# Patient Record
Sex: Male | Born: 1945 | Race: White | Hispanic: No | Marital: Married | Smoking: Former smoker
Health system: Southern US, Community
[De-identification: ages and names within clinical notes are randomized; demographics above are authoritative.]

## PROBLEM LIST (undated history)

## (undated) DIAGNOSIS — K5090 Crohn's disease, unspecified, without complications: Secondary | ICD-10-CM

## (undated) DIAGNOSIS — M1990 Unspecified osteoarthritis, unspecified site: Secondary | ICD-10-CM

## (undated) DIAGNOSIS — E785 Hyperlipidemia, unspecified: Secondary | ICD-10-CM

## (undated) DIAGNOSIS — C801 Malignant (primary) neoplasm, unspecified: Secondary | ICD-10-CM

## (undated) DIAGNOSIS — I2510 Atherosclerotic heart disease of native coronary artery without angina pectoris: Secondary | ICD-10-CM

## (undated) DIAGNOSIS — I493 Ventricular premature depolarization: Secondary | ICD-10-CM

## (undated) DIAGNOSIS — E119 Type 2 diabetes mellitus without complications: Secondary | ICD-10-CM

## (undated) DIAGNOSIS — I739 Peripheral vascular disease, unspecified: Secondary | ICD-10-CM

## (undated) DIAGNOSIS — I10 Essential (primary) hypertension: Secondary | ICD-10-CM

## (undated) HISTORY — DX: Atherosclerotic heart disease of native coronary artery without angina pectoris: I25.10

## (undated) HISTORY — DX: Malignant (primary) neoplasm, unspecified: C80.1

## (undated) HISTORY — DX: Essential (primary) hypertension: I10

## (undated) HISTORY — PX: OTHER SURGICAL HISTORY: SHX169

## (undated) HISTORY — PX: HERNIA REPAIR: SHX51

## (undated) HISTORY — DX: Hyperlipidemia, unspecified: E78.5

## (undated) HISTORY — PX: CATARACT EXTRACTION, BILATERAL: SHX1313

## (undated) HISTORY — DX: Crohn's disease, unspecified, without complications: K50.90

## (undated) HISTORY — DX: Peripheral vascular disease, unspecified: I73.9

---

## 1970-04-14 HISTORY — PX: VASECTOMY: SHX75

## 2001-04-14 HISTORY — PX: INSERTION PROSTATE RADIATION SEED: SUR718

## 2001-04-14 HISTORY — PX: CRYOTHERAPY: SHX1416

## 2002-01-14 ENCOUNTER — Encounter: Admission: RE | Admit: 2002-01-14 | Discharge: 2002-01-14 | Payer: Self-pay | Admitting: Urology

## 2002-01-14 ENCOUNTER — Encounter: Payer: Self-pay | Admitting: Urology

## 2002-01-28 ENCOUNTER — Ambulatory Visit: Admission: RE | Admit: 2002-01-28 | Discharge: 2002-02-11 | Payer: Self-pay | Admitting: Radiation Oncology

## 2002-02-28 ENCOUNTER — Ambulatory Visit: Admission: RE | Admit: 2002-02-28 | Discharge: 2002-04-26 | Payer: Self-pay | Admitting: Radiation Oncology

## 2002-02-28 ENCOUNTER — Encounter: Admission: RE | Admit: 2002-02-28 | Discharge: 2002-02-28 | Payer: Self-pay | Admitting: Urology

## 2002-02-28 ENCOUNTER — Encounter: Payer: Self-pay | Admitting: Urology

## 2002-04-01 ENCOUNTER — Ambulatory Visit (HOSPITAL_BASED_OUTPATIENT_CLINIC_OR_DEPARTMENT_OTHER): Admission: RE | Admit: 2002-04-01 | Discharge: 2002-04-01 | Payer: Self-pay | Admitting: Urology

## 2002-04-22 ENCOUNTER — Ambulatory Visit: Admission: RE | Admit: 2002-04-22 | Discharge: 2002-04-22 | Payer: Self-pay | Admitting: Radiation Oncology

## 2003-11-30 ENCOUNTER — Encounter (INDEPENDENT_AMBULATORY_CARE_PROVIDER_SITE_OTHER): Payer: Self-pay | Admitting: *Deleted

## 2003-11-30 ENCOUNTER — Ambulatory Visit (HOSPITAL_COMMUNITY): Admission: RE | Admit: 2003-11-30 | Discharge: 2003-11-30 | Payer: Self-pay | Admitting: Gastroenterology

## 2003-12-11 ENCOUNTER — Ambulatory Visit (HOSPITAL_COMMUNITY): Admission: RE | Admit: 2003-12-11 | Discharge: 2003-12-11 | Payer: Self-pay | Admitting: Gastroenterology

## 2004-03-26 ENCOUNTER — Encounter: Admission: RE | Admit: 2004-03-26 | Discharge: 2004-03-26 | Payer: Self-pay | Admitting: Gastroenterology

## 2006-09-13 ENCOUNTER — Emergency Department (HOSPITAL_COMMUNITY): Admission: EM | Admit: 2006-09-13 | Discharge: 2006-09-13 | Payer: Self-pay | Admitting: Emergency Medicine

## 2006-09-23 ENCOUNTER — Inpatient Hospital Stay (HOSPITAL_BASED_OUTPATIENT_CLINIC_OR_DEPARTMENT_OTHER): Admission: RE | Admit: 2006-09-23 | Discharge: 2006-09-23 | Payer: Self-pay | Admitting: Cardiovascular Disease

## 2006-12-22 ENCOUNTER — Emergency Department (HOSPITAL_COMMUNITY): Admission: EM | Admit: 2006-12-22 | Discharge: 2006-12-22 | Payer: Self-pay | Admitting: Emergency Medicine

## 2008-10-19 ENCOUNTER — Ambulatory Visit (HOSPITAL_COMMUNITY): Admission: RE | Admit: 2008-10-19 | Discharge: 2008-10-19 | Payer: Self-pay | Admitting: Urology

## 2008-12-01 ENCOUNTER — Ambulatory Visit (HOSPITAL_BASED_OUTPATIENT_CLINIC_OR_DEPARTMENT_OTHER): Admission: RE | Admit: 2008-12-01 | Discharge: 2008-12-01 | Payer: Self-pay | Admitting: Urology

## 2008-12-28 DIAGNOSIS — E1121 Type 2 diabetes mellitus with diabetic nephropathy: Secondary | ICD-10-CM | POA: Insufficient documentation

## 2008-12-28 DIAGNOSIS — K5090 Crohn's disease, unspecified, without complications: Secondary | ICD-10-CM | POA: Insufficient documentation

## 2008-12-28 DIAGNOSIS — I10 Essential (primary) hypertension: Secondary | ICD-10-CM | POA: Insufficient documentation

## 2008-12-28 DIAGNOSIS — E7800 Pure hypercholesterolemia, unspecified: Secondary | ICD-10-CM | POA: Insufficient documentation

## 2009-02-08 DIAGNOSIS — Z0289 Encounter for other administrative examinations: Secondary | ICD-10-CM | POA: Insufficient documentation

## 2009-07-04 ENCOUNTER — Ambulatory Visit (HOSPITAL_BASED_OUTPATIENT_CLINIC_OR_DEPARTMENT_OTHER): Admission: RE | Admit: 2009-07-04 | Discharge: 2009-07-04 | Payer: Self-pay | Admitting: General Surgery

## 2010-02-11 DIAGNOSIS — F17200 Nicotine dependence, unspecified, uncomplicated: Secondary | ICD-10-CM | POA: Insufficient documentation

## 2010-02-11 DIAGNOSIS — F1911 Other psychoactive substance abuse, in remission: Secondary | ICD-10-CM | POA: Insufficient documentation

## 2010-07-07 LAB — BASIC METABOLIC PANEL
CO2: 27 mEq/L (ref 19–32)
Chloride: 105 mEq/L (ref 96–112)
Creatinine, Ser: 0.8 mg/dL (ref 0.4–1.5)
GFR calc Af Amer: >60 mL/min (ref >60)
Glucose, Bld: 242 mg/dL — ABNORMAL HIGH (ref 70–99)

## 2010-07-07 LAB — GLUCOSE, CAPILLARY
Glucose-Capillary: 180 mg/dL — ABNORMAL HIGH (ref 70–99)
Glucose-Capillary: 209 mg/dL — ABNORMAL HIGH (ref 70–99)

## 2010-07-20 LAB — GLUCOSE, CAPILLARY: Glucose-Capillary: 185 mg/dL — ABNORMAL HIGH (ref 70–99)

## 2010-07-20 LAB — POCT I-STAT, CHEM 8
BUN: 18 mg/dL (ref 6–23)
Chloride: 104 mEq/L (ref 96–112)
Glucose, Bld: 177 mg/dL — ABNORMAL HIGH (ref 70–99)
HCT: 42 % (ref 39.0–52.0)
Potassium: 4.5 mEq/L (ref 3.5–5.1)

## 2010-08-16 DIAGNOSIS — E1165 Type 2 diabetes mellitus with hyperglycemia: Secondary | ICD-10-CM | POA: Insufficient documentation

## 2010-08-27 NOTE — H&P (Signed)
NAME:  PERETZ, THIEME NO.:  [id number]   MEDICAL RECORD NO.:  [id number]          PATIENT TYPE:  EMS   LOCATION:  MAJO                         FACILITY:  [facility]   PHYSICIAN:  Vesta Mixer, M.D. DATE OF BIRTH:  1945/11/22   DATE OF ADMISSION:  09/13/2006  DATE OF DISCHARGE:  09/13/2006                              HISTORY & PHYSICAL   Ryan Saunders is a 65 year old gentleman with a history of fatigue and  dehydration, and an abnormal Cardiolite study.  He is now admitted for  heart catheterization for further evaluation of Ryan abnormal Cardiolite  study.   Ryan Saunders has multiple risk factors for coronary artery disease.  He  has a history of hypertension, hypercholesterolemia, diabetes mellitus  and cigarette smoking.  He recently had an episode of syncope.  It was  thought that it maybe just due to dehydration.  He has not had any  further episodes.  He had a stress Cardiolite study which revealed some  attenuation on the inferior wall.  It was unclear whether it was due to  a loop of bowel that was causing some artifact or perhaps real.  We  brought him back in the office today to discuss these results.   Ryan left ventricle appeared to be mildly dilated although the ejection  fraction was normal.   He denies any recent episodes of syncope or presyncope.  He denies any  PND or orthopnea.  He denies any chest discomfort.   CURRENT MEDICATIONS:  1. Lisinopril once a day.  2. Glyburide 5 mg a day.  3. Uroxatral once a day.  4. Lipitor 80 mg a day.  5. Aspirin 81 mg a day.   ALLERGIES:  No allergies.   PAST MEDICAL HISTORY:  1. Hyperlipidemia.  2. Hypertension.  3. Diabetes mellitus.   SOCIAL HISTORY:  The patient quit smoking yesterday.   FAMILY HISTORY:  Ryan Saunders died at age 35 due to complications related  to diabetes mellitus.  Ryan Saunders also had coronary artery disease.  Ryan  Saunders is 65 years old and has Crohn disease.   REVIEW OF  SYSTEMS:  Ryan review of systems is reviewed and is essentially  negative except as noted in the HPI.   PHYSICAL EXAMINATION:  GENERAL:  On exam, he is a middle-aged gentleman  in no acute distress.  He is alert and oriented x3, and Ryan mood and  affect are normal.  VITAL SIGNS:  Ryan weight is 193.  Ryan blood pressure 134/70 with heart  rate of 72.  HEENT:  Exam reveals 2+ carotids.  There is no bruits, no JVD, no  thyromegaly.  LUNGS: Clear to auscultation.  HEART:  Regular rate S1-S2.  ABDOMEN:  Exam reveals good bowel sounds and is nontender.  EXTREMITIES:  Has no clubbing, cyanosis or edema.  NEUROLOGIC:  Exam is nonfocal.   I discussed the situation with Ryan Saunders and Ryan wife.  He would like  to proceed with heart catheterization, so that we can clear air.  He  certainly could be having silent ischemia since he has a history of  diabetes mellitus.  We have discussed the risks, benefits and options of  heart catheterization.  He understands and agrees to proceed.           ______________________________  Vesta Mixer, M.D.     PJN/MEDQ  D:  09/17/2006  T:  09/17/2006  Job:  [id number]   cc:   Ryan Saunders, M.D.

## 2010-08-27 NOTE — Cardiovascular Report (Signed)
NAME:  FRANCISCOJAVIER, WRONSKI NO.:  [id number]   MEDICAL RECORD NO.:  [id number]          PATIENT TYPE:  OIB   LOCATION:  1961                         FACILITY:  [facility]   PHYSICIAN:  Vesta Mixer, M.D. DATE OF BIRTH:  26-Mar-1946   DATE OF PROCEDURE:  09/23/2006  DATE OF DISCHARGE:  09/23/2006                            CARDIAC CATHETERIZATION   Mr. Mano is a 65 year old gentleman with a history of fatigue and a  presyncopal episode.  He had an abnormal Cardiolite study and is now  referred for heart catheterization for further evaluation.   PROCEDURE:  Left heart catheterization with coronary angiography.   The right femoral artery was easily cannulated using the modified  Seldinger technique.   HEMODYNAMIC RESULTS:  LV pressure is 110/9 with an aortic pressure of  118/59.   ANGIOGRAPHY:  Left main:  The left main is fairly smooth and normal.   The left anterior descending artery has a few minor luminal  irregularities.  The first diagonal artery is small to moderate in size  and is normal.  The second diagonal artery is also small to moderate in  size and is normal.   The left circumflex artery is a rather large branch.  It is normal  throughout its course.  The first OM is fairly small but normal.  The  second obtuse marginal are artery is a fairly large branch and extends  down to the posterolateral region.   The right coronary artery is large and dominant.  It is normal  throughout its course.  The posterior descending artery is normal.  There is a very small posterolateral branch.   The left ventriculogram was performed in the 30 RAO position.  It  reveals overall normal left ventricular systolic function.  The ejection  fraction is around 60%.  There is no mitral regurgitation.   COMPLICATIONS:  None.   CONCLUSIONS:  1. Minor coronary artery irregularities.  2. Normal left ventricular systolic function.   We will continue with medical  therapy.          ______________________________  Vesta Mixer, M.D.    PJN/MEDQ  D:  09/23/2006  T:  09/23/2006  Job:  [id number]   cc:   Gaspar Garbe, M.D.

## 2010-08-27 NOTE — Op Note (Signed)
NAME:  Ryan Saunders, Ryan Saunders NO.:  [id number]   MEDICAL RECORD NO.:  [id number]          PATIENT TYPE:  AMB   LOCATION:  NESC                         FACILITY:  [facility]   PHYSICIAN:  Sigmund I. Patsi Sears, M.D.DATE OF BIRTH:  1945-12-03   DATE OF PROCEDURE:  12/01/2008  DATE OF DISCHARGE:                               OPERATIVE REPORT   PREOPERATIVE DIAGNOSIS:  Recurrent prostate adenocarcinoma.   POSTOPERATIVE DIAGNOSIS:  Recurrent prostate adenocarcinoma.   OPERATIONS:  Cryotherapy of the prostate with cystourethroscopy.   SURGEON:  Sigmund I. Patsi Sears, M.D.   ANESTHESIA:  General LMA.   PREPARATION:  After appropriate preanesthesia, the patient is brought to  the operating room and placed on the operating room in dorsal supine  position where general LMA anesthesia was induced.  He was then replaced  in dorsal lithotomy position where the pubis was prepped with Betadine  solution and draped in usual fashion.   REVIEW OF HISTORY:  Mr. Gauthreaux is a 65 year old male, status post I-125  seed implantation in 2003 for Gleason 3+3 adenocarcinoma prostate with a  PSA of 5.8.  The patient has history of PSA of 1.19 in in 2009,  rising  to 2.17 in December, then 3.35 in August 2010.  He is felt to have a  biochemical recurrence.  The patient underwent repeat prostate biopsy,  and too small foci of prostate cancer without treatment effect are  identified in the left lateral base, in the left mid prostate, Gleason  3+3, and Gleason 3+4.  The patient is now for cryotherapy of the  prostate.   PROCEDURE:  Flexible cystoscopy accomplished, and shows that there is no  urethral stricture.  No bladder stone was noted.  There is no  trabeculation and no abnormality of prostate, of the bladder was  identified.  Following this, Foley catheter was placed and 10 mL were  placed in the balloon.  The scrotum was then prepped, and taped away.  Transrectal ultrasonography was  accomplished, and needles were placed in  the prostate in five different rows.  The Foley is removed, and the  flexible cystoscopy again accomplished, with the guidewire placement.  The cooling urethral coil was then placed, and the patient then  undergoes 2 cycles of freezing and following, with the first active thaw  in the second being passive.  The patient tolerated procedure well.  He  then underwent 20 minutes of a passive thaw at the end of the procedure,  and Foley catheter was replaced.  He was given IV Toradol, awakened, and  taken recovery room in good condition.      Sigmund I. Patsi Sears, M.D.  Electronically Signed     SIT/MEDQ  D:  12/01/2008  T:  12/01/2008  Job:  [id number]

## 2010-08-30 NOTE — Op Note (Signed)
NAME:  Ryan Saunders, Ryan Saunders NO.:  [id number]   MEDICAL RECORD NO.:  [id number]                   PATIENT TYPE:  AMB   LOCATION:  ENDO                                 FACILITY:  [facility]   PHYSICIAN:  John C. Madilyn Fireman, M.D.                 DATE OF BIRTH:  [birth year]   DATE OF PROCEDURE:  11/30/2003  DATE OF DISCHARGE:                                 OPERATIVE REPORT   PROCEDURE:  Colonoscopy with polypectomy.   INDICATIONS FOR PROCEDURE:  Terminal ileal thickening on CT scan, ordered to  evaluate hematuria.  The patient has also had no prior colon screening.  He  is essentially asymptomatic from a GI standpoint.   DESCRIPTION OF PROCEDURE:  The patient was placed in the left lateral  decubitus position and placed on the pulse monitor with continuous low-flow  oxygen delivered by nasal cannula.  He was sedated with 100 mcg IV fentanyl  and 8 mg IV Versed.  The Olympus video colonoscope was inserted into the  rectum and advanced to the cecum, confirmed by transillumination at  McBurney's point and visualization of the ileocecal valve and appendiceal  orifice.  The prep was fairly good but somewhat suboptimal in some areas,  and I could not rule out small lesions less than 1 cm in all areas.  The  base of the cecum appeared normal.  The ileocecal valve also appeared  normal.  I could get the scope to the edge of the ileal orifice but never  could achieve deep intubation.  I could see some of the distal ileal mucosa,  and it did not appear grossly inflamed, although it did appear somewhat  tortuous with some possible scarring.  Overall, the endoscopic evaluation of  the terminal ileum was incomplete and inconclusive in regards to the CT  abnormality.  Just distal to the ileocecal valve, was a cluster of small  polyps that appeared to have some associated deformity or scarring of the  mucosa.  This raised the question of these representing pseudopolyps  associated  with inflammatory bowel disease.  I was able to ensnare the  majority of the cluster of  those polyps and removed and cauterized it for  histologic evaluation.  The remainder of the ascending, transverse,  descending, sigmoid, and rectum appeared normal with no further polyps,  masses, diverticula, or other inflammatory changes seen.  The scope was then  withdrawn, and the patient returned to the recovery room in stable  condition.  He tolerated the procedure well, and there were no immediate  complications.   IMPRESSION:  1. Polyps or pseudopolyps of the ascending colon just distal to the     ileocecal valve.  2. Incomplete evaluation of the terminal ileum regarding thickening of the     terminal ileum seen on CT scan.   PLAN:  Await biopsy results and will probably pursue a small bowel series,  particularly  if polyps turn out to be inflammatory rather than neoplastic.                                               John C. Madilyn Fireman, M.D.   JCH/MEDQ  D:  11/30/2003  T:  11/30/2003  Job:  [id number]   cc:   Lynelle Smoke I. Patsi Sears, M.D.  [address]  [place]  [place] [address]  Fax: [phone]

## 2010-08-30 NOTE — Op Note (Signed)
   NAME:  Ryan Saunders, Ryan Saunders NO.:  [id number]   MEDICAL RECORD NO.:  [id number]                   PATIENT TYPE:  AMB   LOCATION:  NESC                                 [facility]ACILITY[facility]  [facility]   PHYSICIAN:  Sigmund I. Patsi Sears, M.D.         DATE OF BIRTH:  06/20/1945   DATE OF PROCEDURE:  04/01/2002  DATE OF DISCHARGE:                                 OPERATIVE REPORT   PREOPERATIVE DIAGNOSIS:  Adenocarcinoma of the prostate.   POSTOPERATIVE DIAGNOSIS:  Adenocarcinoma of the prostate.   OPERATION:  Placement of I-125 seeds for treatment of prostate cancer.   SURGEON:  Sigmund I. Patsi Sears, M.D.   RADIATION THERAPIST:  Wynn Banker, M.D.   PREPARATION:  After appropriate preanesthesia, the patient is brought to the  operating room and placed on the operating table in the dorsal supine  position where general LMA anesthesia was introduced.  He was then re-placed  in the dorsal lithotomy position, where the pubis was prepped with Betadine  solution and draped in the usual fashion.   REVIEW OF HISTORY:  This 65 year old male, has Gleason 3 + 3 (6)  adenocarcinoma of the prostate, with a PSA of 5.8.  He has a 29 cc gland by  ultrasound, an International Prostate Symptoms score sheet of 1/7, and  continues to have erectile function.  The patient is now for radioactive  seed implantation.   DESCRIPTION OF PROCEDURE:  The patient underwent subsequent insertions of 29  needles, with a total number of 90 seeds, of I-125.  He tolerated the  procedure quite well with excellent distribution of seeds.  Photodocumentation was accomplished.  The seeds during cystoscopy was  accomplished at the end of the case  which showed a blood clot in the urethra but no evidence of these seeds  within the urethra.  There were no seeds within the bladder either.  A Foley  catheter was placed; the patient was awakened and taken to the recovery room  in good condition.                                           Sigmund I. Patsi Sears, M.D.    SIT/MEDQ  D:  04/01/2002  T:  04/01/2002  Job:  [id number]   cc:   Wynn Banker, M.D.  [address] [facility]  [place]  [place]  [address]  Fax: [phone]   Dr. [facility]

## 2011-01-30 LAB — POCT CARDIAC MARKERS
CKMB, poc: 1.2
Myoglobin, poc: 69.9
Myoglobin, poc: 70.9
Operator id: 196461
Operator id: 277751

## 2011-01-30 LAB — I-STAT 8, (EC8 V) (CONVERTED LAB)
BUN: 29 — ABNORMAL HIGH
Glucose, Bld: 156 — ABNORMAL HIGH
Hemoglobin: 13.6
Potassium: 4.4
Sodium: 141

## 2013-03-01 DIAGNOSIS — R809 Proteinuria, unspecified: Secondary | ICD-10-CM | POA: Insufficient documentation

## 2013-09-01 ENCOUNTER — Other Ambulatory Visit: Payer: Self-pay | Admitting: Gastroenterology

## 2013-09-21 DIAGNOSIS — D126 Benign neoplasm of colon, unspecified: Secondary | ICD-10-CM | POA: Insufficient documentation

## 2014-03-14 DIAGNOSIS — I739 Peripheral vascular disease, unspecified: Secondary | ICD-10-CM | POA: Insufficient documentation

## 2014-03-14 DIAGNOSIS — N182 Chronic kidney disease, stage 2 (mild): Secondary | ICD-10-CM | POA: Insufficient documentation

## 2014-04-21 ENCOUNTER — Encounter: Payer: Self-pay | Admitting: Cardiovascular Disease

## 2014-04-21 ENCOUNTER — Ambulatory Visit (INDEPENDENT_AMBULATORY_CARE_PROVIDER_SITE_OTHER): Payer: Medicare HMO | Admitting: Cardiovascular Disease

## 2014-04-21 VITALS — BP 128/78 | HR 65 | Ht 72.0 in | Wt 193.0 lb

## 2014-04-21 DIAGNOSIS — I2510 Atherosclerotic heart disease of native coronary artery without angina pectoris: Secondary | ICD-10-CM

## 2014-04-21 DIAGNOSIS — E785 Hyperlipidemia, unspecified: Secondary | ICD-10-CM

## 2014-04-21 DIAGNOSIS — I10 Essential (primary) hypertension: Secondary | ICD-10-CM | POA: Insufficient documentation

## 2014-04-21 NOTE — Progress Notes (Signed)
     Ryan Saunders Date of Birth  [birth year]       [place] [address], [address], [place], [place]  [address]   [place], [place]  [address] [place]   Fax  [phone]     Fax [phone]  Problem List: 1. CAD - mild by cath in 2007 2. Hypertension 3, hyperlipidemia 4. Diabetes Mellitus 5. Prostate cancer  History of Present Illness:  Ryan Saunders is a 69 yo man who I have seen in the past.  He is not having any specific issues. He has not had any CP or dyspnea.  Exercises on occasion.    Plays golf,  Retired from [employer].     Current Outpatient Prescriptions  Medication Sig Dispense Refill  . albuterol (PROAIR HFA) 108 (90 BASE) MCG/ACT inhaler as needed.    Marland Kitchen atorvastatin (LIPITOR) 80 MG tablet Take 80 mg by mouth daily.    Marland Kitchen lisinopril (PRINIVIL,ZESTRIL) 40 MG tablet Take 40 mg by mouth daily.    . norethindrone (NOR-QD) 0.35 MG tablet Take 81 mg by mouth daily.    Marland Kitchen VICTOZA 18 MG/3ML SOPN Inject into the skin daily.  6  . ZETIA 10 MG tablet Take 10 mg by mouth daily.  3   No current facility-administered medications for this visit.     Allergies  Allergen Reactions  . Bee Venom     Other reaction(s): swelling in lips and mouth    Past Medical History  Diagnosis Date  . Coronary artery disease   . PVD (peripheral vascular disease)   . Cancer     PROSTATE CANCER  . Hypertension   . Hyperlipidemia   . Crohn's disease     Past Surgical History  Procedure Laterality Date  . Vasectomy  1972  . Anal fistula repair      History  Smoking status  . Former Smoker  Smokeless tobacco  . Not on file    History  Alcohol Use  . Yes    Comment: SOCAILLY    Family History  Problem Relation Age of Onset  . CVA Mother   . Heart attack Mother   . Crohn's disease Mother   . Diabetes Father   . Heart failure Paternal Grandmother     Reviw of  Systems:  Reviewed in the HPI.  All other systems are negative.  Physical Exam: Blood pressure 128/78, pulse 65, height 6' (1.829 m), weight 193 lb (87.544 kg). Wt Readings from Last 3 Encounters:  04/21/14 193 lb (87.544 kg)     General: Well developed, well nourished, in no acute distress.  Head: Normocephalic, atraumatic, sclera non-icteric, mucus membranes are moist,   Neck: Supple. Carotids are 2 + without bruits. No JVD   Lungs: Clear   Heart: Rr, normal S12  Abdomen: Soft, non-tender, non-distended with normal bowel sounds.  Msk:  Strength and tone are normal   Extremities: No clubbing or cyanosis. No edema.  Distal pedal pulses are 2+ and equal    Neuro: CN II - XII intact.  Alert and oriented X 3.   Psych:  Normal   ECG: Jan. 8, 2016:  NSR at 65,  RBBB,   Assessment / Plan:

## 2014-04-21 NOTE — Patient Instructions (Signed)
Your physician recommends that you continue on your current medications as directed. Please refer to the Current Medication list given to you today.  Your physician wants you to follow-up in: 1 year with Dr. Nahser.  You will receive a reminder letter in the mail two months in advance. If you don't receive a letter, please call our office to schedule the follow-up appointment.  

## 2014-04-21 NOTE — Assessment & Plan Note (Signed)
Has mild CAD by cath in ~ 2007. No angina Continue aggressive treatment of his HTN and hyperlipidemia

## 2014-04-21 NOTE — Assessment & Plan Note (Signed)
Followed closely by Dr. Osborne Casco.

## 2014-04-21 NOTE — Assessment & Plan Note (Signed)
BP is well controlled.  Continue current meds  

## 2015-01-17 ENCOUNTER — Telehealth: Payer: Self-pay | Admitting: Cardiovascular Disease

## 2015-01-17 NOTE — Telephone Encounter (Signed)
Spoke with patient who states on Saturday he had an episode of light-headedness, nausea, diaphoresis, and left hand numbness/paralysis that lasted about 20 minutes.  He states he and his wife were doing yard work when the symptoms began, his wife brought him in and got him cooled down, and that he started feeling better after sitting for a little while.  States he checked his pulse during that time and noticed that it felt irregular and continues to feel irregular.  He states his BP has been normal.  He denies feeling light-headed or dizzy since that time; denies chest pain.  He states he did not note any symptoms prior to Saturday.  I offered him an appointment with Dr. Acie Fredrickson for tomorrow and he verbalized agreement to come for evaluation tomorrow at 4:15.

## 2015-01-17 NOTE — Telephone Encounter (Signed)
New message  Pt called C/O irregular heart beat  Patient c/o Palpitations:  High priority if patient c/o lightheadedness and shortness of breath.  1. How long have you been having palpitations? Second episode in 5-6 years.   2. Are you currently experiencing lightheadedness and shortness of breath? No, not really. Per pt he was working in the yard. Got hot with cold sweats.   3. Have you checked your BP and heart rate? (document readings) Yes. Per pt, BP seems to be fine 120/75 (or something like that says pt)   4. Are you experiencing any other symptoms? "Nope". No blurred vision. Only paralysis in left hand for about 20 minutes.   Comments: Pt states that he feels fine but his heart is beating irregularly. It stops and then resumes

## 2015-01-18 ENCOUNTER — Ambulatory Visit (INDEPENDENT_AMBULATORY_CARE_PROVIDER_SITE_OTHER): Payer: Medicare HMO | Admitting: Cardiovascular Disease

## 2015-01-18 ENCOUNTER — Encounter: Payer: Self-pay | Admitting: Cardiovascular Disease

## 2015-01-18 VITALS — BP 146/94 | HR 65 | Ht 72.0 in | Wt 196.0 lb

## 2015-01-18 DIAGNOSIS — I10 Essential (primary) hypertension: Secondary | ICD-10-CM

## 2015-01-18 DIAGNOSIS — I2510 Atherosclerotic heart disease of native coronary artery without angina pectoris: Secondary | ICD-10-CM

## 2015-01-18 DIAGNOSIS — R002 Palpitations: Secondary | ICD-10-CM

## 2015-01-18 DIAGNOSIS — E860 Dehydration: Secondary | ICD-10-CM | POA: Diagnosis not present

## 2015-01-18 LAB — LIPID PANEL
CHOL/HDL RATIO: 3.5 ratio (ref <=5.0)
CHOLESTEROL: 140 mg/dL (ref 125–200)
HDL: 40 mg/dL (ref >=40)
LDL Cholesterol: 78 mg/dL (ref <130)
Triglycerides: 108 mg/dL (ref <150)
VLDL: 22 mg/dL (ref <30)

## 2015-01-18 LAB — HEPATIC FUNCTION PANEL
ALBUMIN: 4.2 g/dL (ref 3.6–5.1)
ALK PHOS: 79 U/L (ref 40–115)
ALT: 28 U/L (ref 9–46)
AST: 23 U/L (ref 10–35)
BILIRUBIN TOTAL: 0.5 mg/dL (ref 0.2–1.2)
Bilirubin, Direct: 0.1 mg/dL (ref <=0.2)
Indirect Bilirubin: 0.4 mg/dL (ref 0.2–1.2)
Total Protein: 7 g/dL (ref 6.1–8.1)

## 2015-01-18 LAB — BASIC METABOLIC PANEL
BUN: 23 mg/dL (ref 7–25)
CHLORIDE: 103 mmol/L (ref 98–110)
CO2: 26 mmol/L (ref 20–31)
Calcium: 9.5 mg/dL (ref 8.6–10.3)
Creat: 0.86 mg/dL (ref 0.70–1.18)
GLUCOSE: 116 mg/dL — AB (ref 65–99)
POTASSIUM: 4.8 mmol/L (ref 3.5–5.3)
SODIUM: 140 mmol/L (ref 135–146)

## 2015-01-18 NOTE — Progress Notes (Signed)
Ryan Saunders Date of Birth  1946/01/06       [place] [address], [address] [place], [place], [place]  [address]   [place], [place]  [address] [place]   Fax  [phone]     Fax [phone]  Problem List: 1. CAD - mild by cath in 2007 2. Hypertension 3, hyperlipidemia 4. Diabetes Mellitus 5. Prostate cancer  History of Present Illness:  Ryan Saunders is a 69 yo man who I have seen in the past.  He is not having any specific issues. He has not had any CP or dyspnea.  Exercises on occasion.    Plays golf,  Retired from [employer].  Oct. 6. 2016:  No CP , no dyspnea.  Has been having some palpitations. Has had some episodes of nausea and feeling poorly after exercising or being out in the heat.  His symptoms resolved over the next 30 minutes.  Has not tried to aggressively hydrate.     Current Outpatient Prescriptions  Medication Sig Dispense Refill  . albuterol (PROAIR HFA) 108 (90 BASE) MCG/ACT inhaler as needed.    Marland Kitchen aspirin 81 MG tablet Take 81 mg by mouth daily.    Marland Kitchen atorvastatin (LIPITOR) 80 MG tablet Take 80 mg by mouth daily.    . Grape Seed 100 MG CAPS Take 1 capsule by mouth daily.    Marland Kitchen LEVEMIR FLEXTOUCH 100 UNIT/ML Pen     . lisinopril (PRINIVIL,ZESTRIL) 40 MG tablet Take 40 mg by mouth daily.    . Multiple Vitamins-Minerals (ICAPS AREDS 2 PO) Take 1 capsule by mouth daily.    Marland Kitchen VICTOZA 18 MG/3ML SOPN Inject into the skin daily.  6  . ZETIA 10 MG tablet Take 10 mg by mouth daily.  3   No current facility-administered medications for this visit.     Allergies  Allergen Reactions  . Bee Venom     Other reaction(s): swelling in lips and mouth    Past Medical History  Diagnosis Date  . Coronary artery disease   . PVD (peripheral vascular disease) ([place])   . Cancer [facility])     PROSTATE CANCER  . Hypertension   . Hyperlipidemia   . Crohn's disease  [facility])     Past Surgical History  Procedure Laterality Date  . Vasectomy  1972  . Anal fistula repair      History  Smoking status  . Former Smoker  Smokeless tobacco  . Not on file    History  Alcohol Use  . Yes    Comment: SOCAILLY    Family History  Problem Relation Age of Onset  . CVA Mother   . Heart attack Mother   . Crohn's disease Mother   . Diabetes Father   . Heart failure Paternal Grandmother     Reviw of Systems:  Reviewed in the HPI.  All other systems are negative.  Physical Exam: Blood pressure 146/94, pulse 65, height 6' (1.829 m), weight 88.905 kg (196 lb). Wt Readings from Last 3 Encounters:  01/18/15 88.905 kg (196 lb)  04/21/14 87.544 kg (193 lb)     General: Well developed, well nourished, in no acute distress.  Head: Normocephalic, atraumatic, sclera non-icteric, mucus membranes are moist,   Neck: Supple. Carotids are 2 + without bruits. No JVD   Lungs: Clear   Heart: Rr, normal S12  Abdomen: Soft, non-tender, non-distended  with normal bowel sounds.  Msk:  Strength and tone are normal   Extremities: No clubbing or cyanosis. No edema.  Distal pedal pulses are 2+ and equal    Neuro: CN II - XII intact.  Alert and oriented X 3.   Psych:  Normal   ECG: Oct. 6, 2016:  NSR , inc. RBBB   Assessment / Plan:   1. CAD - mild by cath in 2007-  stable 2. Hypertension 3, hyperlipidemia 4. Diabetes Mellitus 5. Prostate cancer 6.  Dehydration: He presents today with some symptoms that sound like volume depleted . He has episodes of nausea and weakness that typically occur if he's been out working in the yard. He also had the same symptoms while walking at the beach. I think that he was probably volume depleted. I've recommended that he increase his intake of fluids. I've recommended that he drink V8 juice occasionally. He also should increase his electrolytes and protein intake.  He'll call me back if his symptoms  persist.    Ryan Saunders, Wonda Cheng, MD  01/18/2015 4:12 PM    [facility] [place],  [place], [place]  [address] Pager [phone] Phone: [phone]; Fax: [phone]   [facility]  [address] [place], [place]  [address] [phone]   Fax [phone]

## 2015-01-18 NOTE — Patient Instructions (Addendum)
Increase your intake of fluids (water with electrolyte tabs like Nun tablets, or gatorade) , protein ( hard boiled eggs, chicken, fish) , and a electrolytes ( V-8 juice, salt, potassium chloride  which is sold as No-Salt  Medication Instructions:  Your physician recommends that you continue on your current medications as directed. Please refer to the Current Medication list given to you today.   Labwork: TODAY - cholesterol, liver, basic metabolic panel, TSH   Testing/Procedures: None Ordered   Follow-Up: Your physician wants you to follow-up in: 6 months with Dr. Acie Fredrickson.  You will receive a reminder letter in the mail two months in advance. If you don't receive a letter, please call our office to schedule the follow-up appointment.

## 2015-01-23 ENCOUNTER — Other Ambulatory Visit (HOSPITAL_COMMUNITY): Payer: Self-pay | Admitting: Urology

## 2015-01-23 DIAGNOSIS — C61 Malignant neoplasm of prostate: Secondary | ICD-10-CM

## 2015-01-23 LAB — TSH

## 2015-02-03 ENCOUNTER — Encounter: Payer: Self-pay | Admitting: Radiation Oncology

## 2015-02-03 NOTE — Progress Notes (Signed)
GU Location of Tumor / Histology: Adenocarcinoma of the Prostate  If Prostate Cancer, Gleason Score is (3+3) and PSA is (3.35),  Recent PSA  4.29 on 01/08/15  Presentation: Mr. Ryan Saunders. [place]    Past/Anticipated interventions by urology, if any: Prior Radioactive Seed Implant 2003 and Cryotherapy 12/01/08  Past/Anticipated interventions by medical oncology, if any: Unknown  Weight changes, if any: no  Bowel/Bladder complaints, if any: IPSS score of 2 with some urgency issues.  Denies bowel issues.   Nausea/Vomiting, if any: no  Pain issues, if any:  no  SAFETY ISSUES:  Prior radiation?  Radioactive Seed Implant in 2003  >    Cryotherapy 12/01/08 and has had about every 7 years  Pacemaker/ICD? no  Possible current pregnancy? N/A  Is the patient on methotrexate? No  Current Complaints / other details:  Patient is here with his wife.  Question Pelvic floor Irradiation vs. Hormone Therapy  BP 149/67 mmHg  Pulse 62  Temp(Src) 97.8 F (36.6 C) (Oral)  Resp 18  Ht 6' (1.829 m)  Wt 202 lb 14.4 oz (92.035 kg)  BMI 27.51 kg/m2

## 2015-02-06 ENCOUNTER — Encounter (HOSPITAL_COMMUNITY)
Admission: RE | Admit: 2015-02-06 | Discharge: 2015-02-06 | Disposition: A | Discharge status: Home / Self Care | Payer: Medicare HMO | Source: Ambulatory Visit | Attending: Urology | Admitting: Urology

## 2015-02-06 DIAGNOSIS — C61 Malignant neoplasm of prostate: Secondary | ICD-10-CM

## 2015-02-06 MED ORDER — TECHNETIUM TC 99M MEDRONATE IV KIT
24.9000 | PACK | Freq: Once | INTRAVENOUS | Status: AC | PRN
  Administered 2015-02-06: 24.9 via INTRAVENOUS

## 2015-02-08 ENCOUNTER — Ambulatory Visit
Admission: RE | Admit: 2015-02-08 | Discharge: 2015-02-08 | Disposition: A | Discharge status: Home / Self Care | Payer: Medicare HMO | Source: Ambulatory Visit | Attending: Radiation Oncology | Admitting: Radiation Oncology

## 2015-02-08 ENCOUNTER — Encounter: Payer: Self-pay | Admitting: Radiation Oncology

## 2015-02-08 VITALS — BP 149/67 | HR 62 | Temp 97.8°F | Resp 18 | Ht 72.0 in | Wt 202.9 lb

## 2015-02-08 DIAGNOSIS — K5090 Crohn's disease, unspecified, without complications: Secondary | ICD-10-CM | POA: Insufficient documentation

## 2015-02-08 DIAGNOSIS — I493 Ventricular premature depolarization: Secondary | ICD-10-CM | POA: Diagnosis not present

## 2015-02-08 DIAGNOSIS — E785 Hyperlipidemia, unspecified: Secondary | ICD-10-CM | POA: Insufficient documentation

## 2015-02-08 DIAGNOSIS — C61 Malignant neoplasm of prostate: Secondary | ICD-10-CM | POA: Insufficient documentation

## 2015-02-08 DIAGNOSIS — I10 Essential (primary) hypertension: Secondary | ICD-10-CM | POA: Insufficient documentation

## 2015-02-08 DIAGNOSIS — I2510 Atherosclerotic heart disease of native coronary artery without angina pectoris: Secondary | ICD-10-CM | POA: Diagnosis not present

## 2015-02-08 DIAGNOSIS — R9721 Rising PSA following treatment for malignant neoplasm of prostate: Secondary | ICD-10-CM | POA: Insufficient documentation

## 2015-02-08 DIAGNOSIS — I739 Peripheral vascular disease, unspecified: Secondary | ICD-10-CM | POA: Insufficient documentation

## 2015-02-08 HISTORY — DX: Ventricular premature depolarization: I49.3

## 2015-02-08 NOTE — Progress Notes (Signed)
Please see the Nurse Progress Note in the MD Initial Consult Encounter for this patient. 

## 2015-02-08 NOTE — Progress Notes (Signed)
[facility] NEW PATIENT EVALUATION  Name: Ryan Saunders MRN: [id number]  Date:   02/08/2015           DOB: 31-Jan-1946  Status: outpatient   CC: Haywood Pao, MD  Carolan Clines, MD    REFERRING PHYSICIAN: Carolan Clines, MD   DIAGNOSIS: Recurrent adenocarcinoma prostate   HISTORY OF PRESENT ILLNESS:  Ryan Saunders is a 69 y.o. male who is seen today through the courtesy of Dr. Gaynelle Arabian for evaluation of his recurrent adenocarcinoma prostate.  He initially presented with a PSA of 5.8 and a Gleason score of 6 and underwent seed implantation with Dr. Danny Lawless in December 2003.  He had excellent post implant dosimetry.  Unfortunately, as his PSA began to rise after almost undetectable levels.  His PSA was 3.35 in 2010.  He underwent cryotherapy with Dr. Gaynelle Arabian.  Follow-up PSAs showed a fall, but then a rise to 2.96 by September 2015, 3.22 by March 2016, and 4.29 by September 2016.  He is doing reasonably well from a GU and GI standpoint.  He did have an anal fistula repair in the past.  His I PSS score is 2.  He does have erectile dysfunction as expected.  No GI difficulties.  PREVIOUS RADIATION THERAPY: Status post prostate seed implant with Dr. Danny Lawless on 04/01/2002.  He had excellent dosimetry and receive 16,000 cGy with iodine 125.   PAST MEDICAL HISTORY:  has a past medical history of Coronary artery disease; PVD (peripheral vascular disease) ([place]); Cancer ([place]); Hypertension; Hyperlipidemia; Crohn's disease ([place]); and PVC (premature ventricular contraction).     PAST SURGICAL HISTORY:  Past Surgical History  Procedure Laterality Date  . Vasectomy  1972  . Anal fistula repair    . Cryotherapy  2003    every 7 years  . Insertion prostate radiation seed  2003     FAMILY HISTORY: family history includes CVA in his mother; Crohn's disease in his mother; Diabetes in his father; Heart attack in his mother; Heart failure in  his paternal grandmother.   SOCIAL HISTORY:  reports that he quit smoking about 11 years ago. His smoking use included Cigarettes. He has a 100 pack-year smoking history. He has never used smokeless tobacco. He reports that he drinks alcohol. He reports that he does not use illicit drugs.   ALLERGIES: Bee venom   MEDICATIONS:  Current Outpatient Prescriptions  Medication Sig Dispense Refill  . aspirin 81 MG tablet Take 81 mg by mouth daily.    Marland Kitchen atorvastatin (LIPITOR) 80 MG tablet Take 80 mg by mouth daily.    . Grape Seed 100 MG CAPS Take 1 capsule by mouth daily.    Marland Kitchen LEVEMIR FLEXTOUCH 100 UNIT/ML Pen     . lisinopril (PRINIVIL,ZESTRIL) 40 MG tablet Take 20 mg by mouth daily.     . Multiple Vitamins-Minerals (ICAPS AREDS 2 PO) Take 1 capsule by mouth daily.    Marland Kitchen VICTOZA 18 MG/3ML SOPN Inject into the skin daily.  6  . ZETIA 10 MG tablet Take 10 mg by mouth daily.  3   No current facility-administered medications for this encounter.     REVIEW OF SYSTEMS:  Pertinent items are noted in HPI.    PHYSICAL EXAM:  height is 6' (1.829 m) and weight is 202 lb 14.4 oz (92.035 kg). His oral temperature is 97.8 F (36.6 C). His blood pressure is 149/67 and his pulse is 62. His respiration is 18.   Alert and  oriented.  He appears to be younger than his stated age of 62.  Rectal examination not performed.   LABORATORY DATA:  Lab Results  Component Value Date   HGB 14.9 07/04/2009   HCT 42.0 12/01/2008   Lab Results  Component Value Date   NA 140 01/18/2015   K 4.8 01/18/2015   CL 103 01/18/2015   CO2 26 01/18/2015   Lab Results  Component Value Date   ALT 28 01/18/2015   AST 23 01/18/2015   ALKPHOS 79 01/18/2015   BILITOT 0.5 01/18/2015   PSA 4.29 from 01/11/2015   IMPRESSION: Recurrent carcinoma the prostate.  I explained the patient that since he had excellent dosimetry with his initial seed implant there are really no areas of the prostate that could receive additional  radiation therapy even if we documented a localized recurrence with saturation biopsies.  Therefore seed implantation or external beam is really not an option.  He may be a candidate for additional cryotherapy.  I explained to him that his prognosis is related to his PSA doubling time which appears to be approximately 1-1/2 years.  I think is certainly reasonable to continue with active surveillance, and perhaps initiate intermittent androgen deprivation therapy when his PSA exceeds 10.  A MRI scan could guide Dr. Gaynelle Arabian if he felt that further cryotherapy was an option.  With the diffusion capability of MRI scan one could determine whether not he has areas of high-grade disease.  However, I would only obtain a MRI scan if this would change his treatment management.  Again, I feel that active surveillance is probably the best option for him to maintain his excellent quality of life.    PLAN: As above.  I spent 45  minutes face to face with the patient and more than 50% of that time was spent in counseling and/or coordination of care.

## 2015-02-09 NOTE — Addendum Note (Signed)
Encounter addended by: Benn Moulder, RN on: 02/09/2015 10:59 AM<BR>     Documentation filed: Charges VN

## 2015-02-14 DIAGNOSIS — C61 Malignant neoplasm of prostate: Secondary | ICD-10-CM | POA: Diagnosis not present

## 2015-03-13 DIAGNOSIS — N182 Chronic kidney disease, stage 2 (mild): Secondary | ICD-10-CM | POA: Diagnosis not present

## 2015-03-13 DIAGNOSIS — E785 Hyperlipidemia, unspecified: Secondary | ICD-10-CM | POA: Diagnosis not present

## 2015-03-13 DIAGNOSIS — E1165 Type 2 diabetes mellitus with hyperglycemia: Secondary | ICD-10-CM | POA: Diagnosis not present

## 2015-03-13 DIAGNOSIS — I2510 Atherosclerotic heart disease of native coronary artery without angina pectoris: Secondary | ICD-10-CM | POA: Diagnosis not present

## 2015-03-13 DIAGNOSIS — Z0000 Encounter for general adult medical examination without abnormal findings: Secondary | ICD-10-CM | POA: Diagnosis not present

## 2015-03-20 DIAGNOSIS — I493 Ventricular premature depolarization: Secondary | ICD-10-CM | POA: Insufficient documentation

## 2015-03-20 DIAGNOSIS — Z0000 Encounter for general adult medical examination without abnormal findings: Secondary | ICD-10-CM | POA: Diagnosis not present

## 2015-03-20 DIAGNOSIS — I10 Essential (primary) hypertension: Secondary | ICD-10-CM | POA: Diagnosis not present

## 2015-03-20 DIAGNOSIS — E1129 Type 2 diabetes mellitus with other diabetic kidney complication: Secondary | ICD-10-CM | POA: Diagnosis not present

## 2015-03-20 DIAGNOSIS — I739 Peripheral vascular disease, unspecified: Secondary | ICD-10-CM | POA: Diagnosis not present

## 2015-03-20 DIAGNOSIS — Z87891 Personal history of nicotine dependence: Secondary | ICD-10-CM | POA: Diagnosis not present

## 2015-03-20 DIAGNOSIS — I2510 Atherosclerotic heart disease of native coronary artery without angina pectoris: Secondary | ICD-10-CM | POA: Diagnosis not present

## 2015-03-20 DIAGNOSIS — E7800 Pure hypercholesterolemia, unspecified: Secondary | ICD-10-CM | POA: Diagnosis not present

## 2015-03-20 DIAGNOSIS — Z794 Long term (current) use of insulin: Secondary | ICD-10-CM | POA: Insufficient documentation

## 2015-03-20 DIAGNOSIS — K5090 Crohn's disease, unspecified, without complications: Secondary | ICD-10-CM | POA: Diagnosis not present

## 2015-03-20 DIAGNOSIS — N182 Chronic kidney disease, stage 2 (mild): Secondary | ICD-10-CM | POA: Diagnosis not present

## 2015-04-25 DIAGNOSIS — R69 Illness, unspecified: Secondary | ICD-10-CM | POA: Diagnosis not present

## 2015-07-17 ENCOUNTER — Ambulatory Visit: Payer: Medicare HMO | Admitting: Cardiovascular Disease

## 2015-08-06 ENCOUNTER — Encounter: Payer: Self-pay | Admitting: Cardiovascular Disease

## 2015-08-06 ENCOUNTER — Ambulatory Visit (INDEPENDENT_AMBULATORY_CARE_PROVIDER_SITE_OTHER): Payer: Medicare HMO | Admitting: Cardiovascular Disease

## 2015-08-06 VITALS — BP 134/80 | HR 60 | Ht 72.0 in | Wt 200.8 lb

## 2015-08-06 DIAGNOSIS — R002 Palpitations: Secondary | ICD-10-CM | POA: Diagnosis not present

## 2015-08-06 DIAGNOSIS — I2510 Atherosclerotic heart disease of native coronary artery without angina pectoris: Secondary | ICD-10-CM

## 2015-08-06 NOTE — Patient Instructions (Signed)

## 2015-08-06 NOTE — Progress Notes (Signed)
Ryan Saunders Date of Birth  22-Feb-1946       [facility] [address], [address], [place], [place]  [address]   [place],   [address] [place]   Fax  [phone]     Fax [phone]  Problem List: 1. CAD - mild by cath in 2007 2. Hypertension 3, hyperlipidemia 4. Diabetes Mellitus 5. Prostate cancer  History of Present Illness:  Ryan Saunders is a 70 yo man who I have seen in the past.  He is not having any specific issues. He has not had any CP or dyspnea.  Exercises on occasion.    Plays golf,  Retired from [employer].  Oct. 6. 2016:  No CP , no dyspnea.  Has been having some palpitations. Has had some episodes of nausea and feeling poorly after exercising or being out in the heat.  His symptoms resolved over the next 30 minutes.  Has not tried to aggressively hydrate.   August 06, 2015:  [name] is doing ok. Still has palpitations. Has been drinking the ages on occasion. He also uses Nun tablets in his water.  Seems to be helping .  He has no symptoms ,   Can only tell that his heart is skipping when he takes his pulse.     Current Outpatient Prescriptions  Medication Sig Dispense Refill  . aspirin 81 MG tablet Take 81 mg by mouth daily.    Marland Kitchen atorvastatin (LIPITOR) 80 MG tablet Take 80 mg by mouth daily.    . Grape Seed 100 MG CAPS Take 1 capsule by mouth daily.    Marland Kitchen LEVEMIR FLEXTOUCH 100 UNIT/ML Pen     . lisinopril (PRINIVIL,ZESTRIL) 40 MG tablet Take 20 mg by mouth daily.     . Multiple Vitamin (MULTIVITAMIN) tablet Take 1 tablet by mouth daily. AREDS    . Multiple Vitamins-Minerals (ICAPS AREDS 2 PO) Take 1 capsule by mouth daily.    Marland Kitchen VICTOZA 18 MG/3ML SOPN Inject into the skin daily.  6  . ZETIA 10 MG tablet Take 10 mg by mouth daily.  3   No current facility-administered medications for this visit.     Allergies  Allergen Reactions  . Bee  Venom     Other reaction(s): swelling in lips and mouth    Past Medical History  Diagnosis Date  . Coronary artery disease   . PVD (peripheral vascular disease) ([place])   . Cancer [facility])     PROSTATE CANCER  . Hypertension   . Hyperlipidemia   . Crohn's disease ([place])   . PVC (premature ventricular contraction)     Past Surgical History  Procedure Laterality Date  . Vasectomy  1972  . Anal fistula repair    . Cryotherapy  2003    every 7 years  . Insertion prostate radiation seed  2003    History  Smoking status  . Former Smoker -- 2.00 packs/day for 50 years  . Types: Cigarettes  . Quit date: 02/08/2004  Smokeless tobacco  . Never Used    History  Alcohol Use  . 0.0 oz/week  . 0 Standard drinks or equivalent per week    Comment: SOCAILLY    Family History  Problem Relation Age of Onset  . CVA Mother   . Heart attack Mother   . Crohn's disease Mother   . Diabetes Father   .  Heart failure Paternal Grandmother     Reviw of Systems:  Reviewed in the HPI.  All other systems are negative.  Physical Exam: Blood pressure 134/80, pulse 60, height 6' (1.829 m), weight 200 lb 12.8 oz (91.082 kg). Wt Readings from Last 3 Encounters:  08/06/15 200 lb 12.8 oz (91.082 kg)  02/08/15 202 lb 14.4 oz (92.035 kg)  01/18/15 196 lb (88.905 kg)     General: Well developed, well nourished, in no acute distress.  Head: Normocephalic, atraumatic, sclera non-icteric, mucus membranes are moist,   Neck: Supple. Carotids are 2 + without bruits. No JVD   Lungs: Clear   Heart: Rr, normal S12  Abdomen: Soft, non-tender, non-distended with normal bowel sounds.  Msk:  Strength and tone are normal   Extremities: No clubbing or cyanosis. No edema.  Distal pedal pulses are 2+ and equal    Neuro: CN II - XII intact.  Alert and oriented X 3.   Psych:  Normal   ECG:  Assessment / Plan:   1. CAD - mild by cath in 2007-  stable 2. Hypertension 3, hyperlipidemia - managed by  his primary  4. Diabetes Mellitus 5. Prostate cancer 6.  Palpitations:  Seems to be better with better rehydration . Continue aggressive hydration . He would like to return in 1 year.   He'll call me back if his symptoms persist.    Nahser, Wonda Cheng, MD  08/06/2015 4:31 PM    [facility] [address],  [place], [place]  [address] Pager [phone] Phone: [phone]; Fax: [phone]   [facility]  [address] [place], [place]  [address] [phone]   Fax [phone]

## 2015-08-08 ENCOUNTER — Encounter: Payer: Self-pay | Admitting: Cardiovascular Disease

## 2015-09-24 DIAGNOSIS — I1310 Hypertensive heart and chronic kidney disease without heart failure, with stage 1 through stage 4 chronic kidney disease, or unspecified chronic kidney disease: Secondary | ICD-10-CM | POA: Insufficient documentation

## 2015-09-26 DIAGNOSIS — E1165 Type 2 diabetes mellitus with hyperglycemia: Secondary | ICD-10-CM | POA: Diagnosis not present

## 2015-09-26 DIAGNOSIS — I493 Ventricular premature depolarization: Secondary | ICD-10-CM | POA: Diagnosis not present

## 2015-09-26 DIAGNOSIS — I10 Essential (primary) hypertension: Secondary | ICD-10-CM | POA: Diagnosis not present

## 2015-09-26 DIAGNOSIS — E7800 Pure hypercholesterolemia, unspecified: Secondary | ICD-10-CM | POA: Diagnosis not present

## 2015-09-26 DIAGNOSIS — I1310 Hypertensive heart and chronic kidney disease without heart failure, with stage 1 through stage 4 chronic kidney disease, or unspecified chronic kidney disease: Secondary | ICD-10-CM | POA: Diagnosis not present

## 2015-09-26 DIAGNOSIS — Z794 Long term (current) use of insulin: Secondary | ICD-10-CM | POA: Diagnosis not present

## 2015-09-26 DIAGNOSIS — E1129 Type 2 diabetes mellitus with other diabetic kidney complication: Secondary | ICD-10-CM | POA: Diagnosis not present

## 2015-09-26 DIAGNOSIS — Z6827 Body mass index (BMI) 27.0-27.9, adult: Secondary | ICD-10-CM | POA: Diagnosis not present

## 2015-09-26 DIAGNOSIS — N182 Chronic kidney disease, stage 2 (mild): Secondary | ICD-10-CM | POA: Diagnosis not present

## 2015-10-22 DIAGNOSIS — R69 Illness, unspecified: Secondary | ICD-10-CM | POA: Diagnosis not present

## 2015-12-03 DIAGNOSIS — Z961 Presence of intraocular lens: Secondary | ICD-10-CM | POA: Diagnosis not present

## 2015-12-03 DIAGNOSIS — E113292 Type 2 diabetes mellitus with mild nonproliferative diabetic retinopathy without macular edema, left eye: Secondary | ICD-10-CM | POA: Diagnosis not present

## 2015-12-03 DIAGNOSIS — H35032 Hypertensive retinopathy, left eye: Secondary | ICD-10-CM | POA: Diagnosis not present

## 2015-12-03 DIAGNOSIS — E119 Type 2 diabetes mellitus without complications: Secondary | ICD-10-CM | POA: Diagnosis not present

## 2015-12-03 DIAGNOSIS — H353122 Nonexudative age-related macular degeneration, left eye, intermediate dry stage: Secondary | ICD-10-CM | POA: Diagnosis not present

## 2015-12-03 DIAGNOSIS — H35031 Hypertensive retinopathy, right eye: Secondary | ICD-10-CM | POA: Diagnosis not present

## 2015-12-26 DIAGNOSIS — R69 Illness, unspecified: Secondary | ICD-10-CM | POA: Diagnosis not present

## 2016-01-13 DIAGNOSIS — R69 Illness, unspecified: Secondary | ICD-10-CM | POA: Diagnosis not present

## 2016-02-14 DIAGNOSIS — C61 Malignant neoplasm of prostate: Secondary | ICD-10-CM | POA: Diagnosis not present

## 2016-03-14 DIAGNOSIS — C61 Malignant neoplasm of prostate: Secondary | ICD-10-CM | POA: Diagnosis not present

## 2016-03-17 ENCOUNTER — Encounter: Payer: Self-pay | Admitting: Cardiovascular Disease

## 2016-03-17 DIAGNOSIS — I10 Essential (primary) hypertension: Secondary | ICD-10-CM | POA: Diagnosis not present

## 2016-03-17 DIAGNOSIS — Z125 Encounter for screening for malignant neoplasm of prostate: Secondary | ICD-10-CM | POA: Diagnosis not present

## 2016-03-17 DIAGNOSIS — E7800 Pure hypercholesterolemia, unspecified: Secondary | ICD-10-CM | POA: Diagnosis not present

## 2016-03-24 DIAGNOSIS — I739 Peripheral vascular disease, unspecified: Secondary | ICD-10-CM | POA: Diagnosis not present

## 2016-03-24 DIAGNOSIS — Z87891 Personal history of nicotine dependence: Secondary | ICD-10-CM | POA: Diagnosis not present

## 2016-03-24 DIAGNOSIS — Z794 Long term (current) use of insulin: Secondary | ICD-10-CM | POA: Diagnosis not present

## 2016-03-24 DIAGNOSIS — I1310 Hypertensive heart and chronic kidney disease without heart failure, with stage 1 through stage 4 chronic kidney disease, or unspecified chronic kidney disease: Secondary | ICD-10-CM | POA: Diagnosis not present

## 2016-03-24 DIAGNOSIS — R808 Other proteinuria: Secondary | ICD-10-CM | POA: Diagnosis not present

## 2016-03-24 DIAGNOSIS — D126 Benign neoplasm of colon, unspecified: Secondary | ICD-10-CM | POA: Diagnosis not present

## 2016-03-24 DIAGNOSIS — I2510 Atherosclerotic heart disease of native coronary artery without angina pectoris: Secondary | ICD-10-CM | POA: Diagnosis not present

## 2016-03-24 DIAGNOSIS — N182 Chronic kidney disease, stage 2 (mild): Secondary | ICD-10-CM | POA: Diagnosis not present

## 2016-03-24 DIAGNOSIS — Z0000 Encounter for general adult medical examination without abnormal findings: Secondary | ICD-10-CM | POA: Diagnosis not present

## 2016-03-24 DIAGNOSIS — Z6827 Body mass index (BMI) 27.0-27.9, adult: Secondary | ICD-10-CM | POA: Diagnosis not present

## 2016-07-21 ENCOUNTER — Encounter: Payer: Self-pay | Admitting: Cardiovascular Disease

## 2016-08-07 ENCOUNTER — Encounter: Payer: Self-pay | Admitting: Cardiovascular Disease

## 2016-08-07 ENCOUNTER — Ambulatory Visit (INDEPENDENT_AMBULATORY_CARE_PROVIDER_SITE_OTHER): Payer: Medicare HMO | Admitting: Cardiovascular Disease

## 2016-08-07 ENCOUNTER — Encounter (INDEPENDENT_AMBULATORY_CARE_PROVIDER_SITE_OTHER): Payer: Self-pay

## 2016-08-07 VITALS — BP 130/78 | HR 65 | Ht 72.0 in | Wt 201.6 lb

## 2016-08-07 DIAGNOSIS — E782 Mixed hyperlipidemia: Secondary | ICD-10-CM

## 2016-08-07 DIAGNOSIS — I493 Ventricular premature depolarization: Secondary | ICD-10-CM

## 2016-08-07 DIAGNOSIS — I10 Essential (primary) hypertension: Secondary | ICD-10-CM

## 2016-08-07 NOTE — Progress Notes (Signed)
Ryan Saunders Date of Birth  [birth year]       [facility] [address], [address], [place], [place]  [address]   [place], [place]  [address] [place]   Fax  [phone]     Fax [phone]  Problem List: 1. CAD - mild by cath in 2007 2. Hypertension 3, hyperlipidemia 4. Diabetes Mellitus 5. Prostate cancer  History of Present Illness:  Ryan Saunders is a 71 yo man who I have seen in the past.  He is not having any specific issues. He has not had any CP or dyspnea.  Exercises on occasion.    Plays golf,  Retired from [employer].  Oct. 6. 2016:  No CP , no dyspnea.  Has been having some palpitations. Has had some episodes of nausea and feeling poorly after exercising or being out in the heat.  His symptoms resolved over the next 30 minutes.  Has not tried to aggressively hydrate.   August 06, 2015:  Ryan Saunders is doing ok. Still has palpitations. Has been drinking the ages on occasion. He also uses Nun tablets in his water.  Seems to be helping .  He has no symptoms ,   Can only tell that his heart is skipping when he takes his pulse.   August 07, 2016:  Seen for follow up of palpitations, weakness and volume depletion.  Doing great .    PSA has been increasing slowly .   Current Outpatient Prescriptions  Medication Sig Dispense Refill  . aspirin 81 MG tablet Take 81 mg by mouth daily.    Marland Kitchen atorvastatin (LIPITOR) 80 MG tablet Take 80 mg by mouth daily.    Marland Kitchen LEVEMIR FLEXTOUCH 100 UNIT/ML Pen     . lisinopril (PRINIVIL,ZESTRIL) 40 MG tablet Take 20 mg by mouth daily.     . Multiple Vitamin (MULTIVITAMIN) tablet Take 1 tablet by mouth daily. AREDS    . VICTOZA 18 MG/3ML SOPN Inject into the skin daily.  6  . ZETIA 10 MG tablet Take 10 mg by mouth daily.  3   No current facility-administered medications for this visit.      Allergies  Allergen Reactions  . Bee  Venom     Other reaction(s): swelling in lips and mouth    Past Medical History:  Diagnosis Date  . Cancer [facility])    PROSTATE CANCER  . Coronary artery disease   . Crohn's disease ([place])   . Hyperlipidemia   . Hypertension   . PVC (premature ventricular contraction)   . PVD (peripheral vascular disease) ([place])     Past Surgical History:  Procedure Laterality Date  . ANAL FISTULA REPAIR    . CRYOTHERAPY  2003   every 7 years  . INSERTION PROSTATE RADIATION SEED  2003  . VASECTOMY  1972    History  Smoking Status  . Former Smoker  . Packs/day: 2.00  . Years: 50.00  . Types: Cigarettes  . Quit date: 02/08/2004  Smokeless Tobacco  . Never Used    History  Alcohol Use  . 0.0 oz/week    Comment: SOCAILLY    Family History  Problem Relation Age of Onset  . CVA Mother   . Heart attack Mother   . Crohn's disease Mother   . Diabetes Father   . Heart failure Paternal Grandmother     Reviw of Systems:  Reviewed in the HPI.  All other systems are negative.  Physical Exam: Blood pressure 130/78, pulse 65, height 6' (1.829 m), weight 201 lb 9.6 oz (91.4 kg), SpO2 97 %. Wt Readings from Last 3 Encounters:  08/07/16 201 lb 9.6 oz (91.4 kg)  08/06/15 200 lb 12.8 oz (91.1 kg)  02/08/15 202 lb 14.4 oz (92 kg)     General: Well developed, well nourished, in no acute distress.  Head: Normocephalic, atraumatic, sclera non-icteric, mucus membranes are moist,   Neck: Supple. Carotids are 2 + without bruits. No JVD   Lungs: Clear   Heart: Rr, normal S12  Abdomen: Soft, non-tender, non-distended with normal bowel sounds.  Msk:  Strength and tone are normal   Extremities: No clubbing or cyanosis. No edema.  Distal pedal pulses are 2+ and equal    Neuro: CN II - XII intact.  Alert and oriented X 3.   Psych:  Normal   ECG: August 07, 2016.   Sinus rhythm at 65.   Frequent PVCs. Inc. RBBB .   Assessment / Plan:   1. CAD - mild by cath in 2007-  stable 2.  Hypertension - BP is well controlled.  3, hyperlipidemia - managed by his primary  4. Diabetes Mellitus 5. Prostate cancer 6.  Palpitations:  Seems to be better with better rehydration.  Has PVCs on ECG .   Continue aggressive hydration . He would like to return in 1 year.   He'll call me back if his symptoms persist.    Ryan Moores, MD  08/07/2016 9:34 AM    [facility] [address],  [place], [place]  [address] Pager [phone] Phone: [phone]; Fax: [phone]

## 2016-08-07 NOTE — Patient Instructions (Signed)
Medication Instructions:  Your physician recommends that you continue on your current medications as directed. Please refer to the Current Medication list given to you today.   Labwork: None Ordered   Testing/Procedures: None Ordered   Follow-Up: Your physician wants you to follow-up in: 1 year with Dr. Nahser.  You will receive a reminder letter in the mail two months in advance. If you don't receive a letter, please call our office to schedule the follow-up appointment.   If you need a refill on your cardiac medications before your next appointment, please call your pharmacy.   Thank you for choosing [facility]! Kendan Cornforth, RN [phone]    

## 2016-09-10 DIAGNOSIS — C61 Malignant neoplasm of prostate: Secondary | ICD-10-CM | POA: Diagnosis not present

## 2016-09-25 DIAGNOSIS — R972 Elevated prostate specific antigen [PSA]: Secondary | ICD-10-CM | POA: Diagnosis not present

## 2016-09-25 DIAGNOSIS — E1129 Type 2 diabetes mellitus with other diabetic kidney complication: Secondary | ICD-10-CM | POA: Diagnosis not present

## 2016-09-25 DIAGNOSIS — I1310 Hypertensive heart and chronic kidney disease without heart failure, with stage 1 through stage 4 chronic kidney disease, or unspecified chronic kidney disease: Secondary | ICD-10-CM | POA: Diagnosis not present

## 2016-09-25 DIAGNOSIS — Z794 Long term (current) use of insulin: Secondary | ICD-10-CM | POA: Diagnosis not present

## 2016-09-25 DIAGNOSIS — K5090 Crohn's disease, unspecified, without complications: Secondary | ICD-10-CM | POA: Diagnosis not present

## 2016-09-25 DIAGNOSIS — R808 Other proteinuria: Secondary | ICD-10-CM | POA: Diagnosis not present

## 2016-09-25 DIAGNOSIS — E7800 Pure hypercholesterolemia, unspecified: Secondary | ICD-10-CM | POA: Diagnosis not present

## 2016-09-25 DIAGNOSIS — I2510 Atherosclerotic heart disease of native coronary artery without angina pectoris: Secondary | ICD-10-CM | POA: Diagnosis not present

## 2016-09-25 DIAGNOSIS — N182 Chronic kidney disease, stage 2 (mild): Secondary | ICD-10-CM | POA: Diagnosis not present

## 2016-09-25 DIAGNOSIS — I7389 Other specified peripheral vascular diseases: Secondary | ICD-10-CM | POA: Diagnosis not present

## 2016-09-30 DIAGNOSIS — C61 Malignant neoplasm of prostate: Secondary | ICD-10-CM | POA: Diagnosis not present

## 2016-11-19 DIAGNOSIS — K5730 Diverticulosis of large intestine without perforation or abscess without bleeding: Secondary | ICD-10-CM | POA: Diagnosis not present

## 2016-11-19 DIAGNOSIS — Z86010 Personal history of colonic polyps: Secondary | ICD-10-CM | POA: Diagnosis not present

## 2016-12-08 DIAGNOSIS — E113292 Type 2 diabetes mellitus with mild nonproliferative diabetic retinopathy without macular edema, left eye: Secondary | ICD-10-CM | POA: Diagnosis not present

## 2016-12-08 DIAGNOSIS — I708 Atherosclerosis of other arteries: Secondary | ICD-10-CM | POA: Diagnosis not present

## 2016-12-08 DIAGNOSIS — H35033 Hypertensive retinopathy, bilateral: Secondary | ICD-10-CM | POA: Diagnosis not present

## 2016-12-08 DIAGNOSIS — H353122 Nonexudative age-related macular degeneration, left eye, intermediate dry stage: Secondary | ICD-10-CM | POA: Diagnosis not present

## 2016-12-21 DIAGNOSIS — R69 Illness, unspecified: Secondary | ICD-10-CM | POA: Diagnosis not present

## 2017-02-03 DIAGNOSIS — R69 Illness, unspecified: Secondary | ICD-10-CM | POA: Diagnosis not present

## 2017-03-20 DIAGNOSIS — I10 Essential (primary) hypertension: Secondary | ICD-10-CM | POA: Diagnosis not present

## 2017-03-20 DIAGNOSIS — E7800 Pure hypercholesterolemia, unspecified: Secondary | ICD-10-CM | POA: Diagnosis not present

## 2017-03-20 DIAGNOSIS — R82998 Other abnormal findings in urine: Secondary | ICD-10-CM | POA: Diagnosis not present

## 2017-03-20 DIAGNOSIS — E1129 Type 2 diabetes mellitus with other diabetic kidney complication: Secondary | ICD-10-CM | POA: Diagnosis not present

## 2017-03-24 DIAGNOSIS — C61 Malignant neoplasm of prostate: Secondary | ICD-10-CM | POA: Diagnosis not present

## 2017-03-27 DIAGNOSIS — E1129 Type 2 diabetes mellitus with other diabetic kidney complication: Secondary | ICD-10-CM | POA: Diagnosis not present

## 2017-03-27 DIAGNOSIS — R972 Elevated prostate specific antigen [PSA]: Secondary | ICD-10-CM | POA: Diagnosis not present

## 2017-03-27 DIAGNOSIS — E7800 Pure hypercholesterolemia, unspecified: Secondary | ICD-10-CM | POA: Diagnosis not present

## 2017-03-27 DIAGNOSIS — Z794 Long term (current) use of insulin: Secondary | ICD-10-CM | POA: Diagnosis not present

## 2017-03-27 DIAGNOSIS — E1151 Type 2 diabetes mellitus with diabetic peripheral angiopathy without gangrene: Secondary | ICD-10-CM | POA: Diagnosis not present

## 2017-03-27 DIAGNOSIS — I1310 Hypertensive heart and chronic kidney disease without heart failure, with stage 1 through stage 4 chronic kidney disease, or unspecified chronic kidney disease: Secondary | ICD-10-CM | POA: Diagnosis not present

## 2017-03-27 DIAGNOSIS — R808 Other proteinuria: Secondary | ICD-10-CM | POA: Diagnosis not present

## 2017-03-27 DIAGNOSIS — I2510 Atherosclerotic heart disease of native coronary artery without angina pectoris: Secondary | ICD-10-CM | POA: Diagnosis not present

## 2017-03-27 DIAGNOSIS — I7389 Other specified peripheral vascular diseases: Secondary | ICD-10-CM | POA: Diagnosis not present

## 2017-03-27 DIAGNOSIS — Z0000 Encounter for general adult medical examination without abnormal findings: Secondary | ICD-10-CM | POA: Diagnosis not present

## 2017-03-31 DIAGNOSIS — C61 Malignant neoplasm of prostate: Secondary | ICD-10-CM | POA: Diagnosis not present

## 2017-03-31 DIAGNOSIS — R351 Nocturia: Secondary | ICD-10-CM | POA: Diagnosis not present

## 2017-05-26 ENCOUNTER — Other Ambulatory Visit: Payer: Self-pay | Admitting: Urology

## 2017-05-26 DIAGNOSIS — C61 Malignant neoplasm of prostate: Secondary | ICD-10-CM

## 2017-06-12 DIAGNOSIS — R69 Illness, unspecified: Secondary | ICD-10-CM | POA: Diagnosis not present

## 2017-06-23 ENCOUNTER — Encounter (HOSPITAL_COMMUNITY)
Admission: RE | Admit: 2017-06-23 | Discharge: 2017-06-23 | Disposition: A | Discharge status: Home / Self Care | Payer: Medicare HMO | Source: Ambulatory Visit | Attending: Urology | Admitting: Urology

## 2017-06-23 ENCOUNTER — Ambulatory Visit (HOSPITAL_COMMUNITY)
Admission: RE | Admit: 2017-06-23 | Discharge: 2017-06-23 | Disposition: A | Discharge status: Home / Self Care | Payer: Medicare HMO | Source: Ambulatory Visit | Attending: Urology | Admitting: Urology

## 2017-06-23 DIAGNOSIS — C61 Malignant neoplasm of prostate: Secondary | ICD-10-CM | POA: Diagnosis present

## 2017-06-23 DIAGNOSIS — Z8546 Personal history of malignant neoplasm of prostate: Secondary | ICD-10-CM | POA: Diagnosis not present

## 2017-06-23 DIAGNOSIS — N329 Bladder disorder, unspecified: Secondary | ICD-10-CM | POA: Diagnosis not present

## 2017-06-23 DIAGNOSIS — K5730 Diverticulosis of large intestine without perforation or abscess without bleeding: Secondary | ICD-10-CM | POA: Diagnosis not present

## 2017-06-23 DIAGNOSIS — M799 Soft tissue disorder, unspecified: Secondary | ICD-10-CM | POA: Insufficient documentation

## 2017-06-23 DIAGNOSIS — D215 Benign neoplasm of connective and other soft tissue of pelvis: Secondary | ICD-10-CM | POA: Diagnosis not present

## 2017-06-23 DIAGNOSIS — R972 Elevated prostate specific antigen [PSA]: Secondary | ICD-10-CM | POA: Diagnosis not present

## 2017-06-23 LAB — POCT I-STAT CREATININE: CREATININE: 0.9 mg/dL (ref 0.61–1.24)

## 2017-06-23 MED ORDER — GADOBENATE DIMEGLUMINE 529 MG/ML IV SOLN
20.0000 mL | Freq: Once | INTRAVENOUS | Status: AC | PRN
  Administered 2017-06-23: 19 mL via INTRAVENOUS

## 2017-06-23 MED ORDER — TECHNETIUM TC 99M MEDRONATE IV KIT
25.0000 | PACK | Freq: Once | INTRAVENOUS | Status: AC | PRN
  Administered 2017-06-23: 25 via INTRAVENOUS

## 2017-06-30 DIAGNOSIS — Z8546 Personal history of malignant neoplasm of prostate: Secondary | ICD-10-CM | POA: Diagnosis not present

## 2017-06-30 DIAGNOSIS — C61 Malignant neoplasm of prostate: Secondary | ICD-10-CM | POA: Diagnosis not present

## 2017-06-30 DIAGNOSIS — C7919 Secondary malignant neoplasm of other urinary organs: Secondary | ICD-10-CM | POA: Diagnosis not present

## 2017-06-30 DIAGNOSIS — R9721 Rising PSA following treatment for malignant neoplasm of prostate: Secondary | ICD-10-CM | POA: Diagnosis not present

## 2017-09-25 ENCOUNTER — Encounter: Payer: Self-pay | Admitting: Cardiovascular Disease

## 2017-09-25 ENCOUNTER — Ambulatory Visit: Payer: Medicare HMO | Admitting: Cardiovascular Disease

## 2017-09-25 VITALS — BP 146/80 | HR 58 | Ht 72.0 in | Wt 194.4 lb

## 2017-09-25 DIAGNOSIS — I493 Ventricular premature depolarization: Secondary | ICD-10-CM | POA: Diagnosis not present

## 2017-09-25 DIAGNOSIS — I2510 Atherosclerotic heart disease of native coronary artery without angina pectoris: Secondary | ICD-10-CM | POA: Diagnosis not present

## 2017-09-25 NOTE — Progress Notes (Signed)
Ryan Saunders Date of Birth  1946-02-26       [facility] [address], [place], [place]  [address]   [place], [place]  [address] [place]   Fax  [phone]     Fax [phone]  Problem List: 1. CAD - mild by cath in 2007 2. Hypertension 3, hyperlipidemia 4. Diabetes Mellitus 5. Prostate cancer    Ryan Saunders is a 72 yo man who I have seen in the past.  He is not having any specific issues. He has not had any CP or dyspnea.  Exercises on occasion.    Plays golf,  Retired from [employer].  Oct. 6. 2016:  No CP , no dyspnea.  Has been having some palpitations. Has had some episodes of nausea and feeling poorly after exercising or being out in the heat.  His symptoms resolved over the next 30 minutes.  Has not tried to aggressively hydrate.   August 06, 2015:  Ryan Saunders is doing ok. Still has palpitations. Has been drinking the ages on occasion. He also uses Nun tablets in his water.  Seems to be helping .  He has no symptoms ,   Can only tell that his heart is skipping when he takes his pulse.   August 07, 2016:  Seen for follow up of palpitations, weakness and volume depletion.  Doing great .    PSA has been increasing slowly .   September 25, 2017  Ryan Saunders is seen today . No CP or dyspnea . Is on Lupron shots now ,  Has had cryoablation,  Has had radiation implants  Has had 2 separate prostate cancers .   Not a candidate for XRT  BP at home is well controlled.   Avoids salt,    Has lost some weight . Gets regular exercise  Is followed by Dr. Osborne Casco.   Current Outpatient Medications  Medication Sig Dispense Refill  . atorvastatin (LIPITOR) 80 MG tablet Take 80 mg by mouth daily.    Marland Kitchen Leuprolide Acetate, 6 Month, (LUPRON DEPOT, 44-MONTH, IM) Inject 1 Dose into the muscle every 6 (six) months.    Ernest Mallick FLEXTOUCH 100 UNIT/ML Pen     . lisinopril  (PRINIVIL,ZESTRIL) 40 MG tablet Take 20 mg by mouth daily.     . Multiple Vitamin (MULTIVITAMIN) tablet Take 1 tablet by mouth daily. AREDS    . VICTOZA 18 MG/3ML SOPN Inject into the skin daily.  6  . ZETIA 10 MG tablet Take 10 mg by mouth daily.  3  . aspirin 81 MG tablet Take 81 mg by mouth daily.     No current facility-administered medications for this visit.      Allergies  Allergen Reactions  . Bee Venom     Other reaction(s): swelling in lips and mouth    Past Medical History:  Diagnosis Date  . Cancer [facility])    PROSTATE CANCER  . Coronary artery disease   . Crohn's disease ([place])   . Hyperlipidemia   . Hypertension   . PVC (premature ventricular contraction)   . PVD (peripheral vascular disease) ([place])     Past Surgical History:  Procedure Laterality Date  . ANAL FISTULA REPAIR    . CRYOTHERAPY  2003   every 7 years  . INSERTION PROSTATE RADIATION SEED  2003  . [place] History  Tobacco Use  Smoking Status Former Smoker  . Packs/day: 2.00  . Years: 50.00  . Pack years: 100.00  . Types: Cigarettes  . Last attempt to quit: 02/08/2004  . Years since quitting: 13.6  Smokeless Tobacco Never Used    Social History   Substance and Sexual Activity  Alcohol Use Yes  . Alcohol/week: 0.0 oz   Comment: SOCAILLY    Family History  Problem Relation Age of Onset  . CVA Mother   . Heart attack Mother   . Crohn's disease Mother   . Diabetes Father   . Heart failure Paternal Grandmother     Reviw of Systems:  Reviewed in the HPI.  All other systems are negative.  Physical Exam: Blood pressure (!) 146/80, pulse (!) 58, height 6' (1.829 m), weight 194 lb 6.4 oz (88.2 kg), SpO2 96 %.  GEN:  Well nourished, well developed in no acute distress HEENT: Normal NECK: No JVD; No carotid bruits LYMPHATICS: No lymphadenopathy CARDIAC: RR,  Occasional premature beat .  RESPIRATORY:  Clear to auscultation without rales, wheezing or rhonchi    ABDOMEN: Soft, non-tender, non-distended MUSCULOSKELETAL:  No edema; No deformity  SKIN: Warm and dry NEUROLOGIC:  Alert and oriented x 3   ECG: September 25, 2017: Sinus bradycardia at 58.  First-degree AV block.  Right bundle branch block.  Assessment / Plan:   1. CAD -has mild coronary artery disease by heart catheterization in 2007.  He is not having any angina.  Continue meds  2. Hypertension -blood pressure is well controlled at home.  It is slightly elevated here.  He has been watching his salt.  He is very active and walks on a regular basis. 3, hyperlipidemia -   managed by Dr. Osborne Casco. 4. Diabetes Mellitus -his last hemoglobin A1c is 7.2.  He is continue to work with Dr. Osborne Casco on this.  5. Prostate cancer -he is now on Lupron shots.   Will have him see an APP in 1 year. I'll see him in 2 years.       Mertie Moores, MD  09/25/2017 8:36 AM    [facility],  [place], [place]  [address] Pager [phone] Phone: [phone]; Fax: [phone]

## 2017-09-25 NOTE — Patient Instructions (Signed)
Medication Instructions:  Your physician recommends that you continue on your current medications as directed. Please refer to the Current Medication list given to you today.   Labwork: None Ordered   Testing/Procedures: None Ordered   Follow-Up: Your physician wants you to follow-up in: 1 year with a PA or NP on Dr. Elmarie Shiley team. You will receive a reminder letter in the mail two months in advance. If you don't receive a letter, please call our office to schedule the follow-up appointment.   If you need a refill on your cardiac medications before your next appointment, please call your pharmacy.   Thank you for choosing [facility]! Christen Bame, RN [phone]

## 2017-09-28 DIAGNOSIS — C61 Malignant neoplasm of prostate: Secondary | ICD-10-CM | POA: Diagnosis not present

## 2017-09-28 DIAGNOSIS — Z8546 Personal history of malignant neoplasm of prostate: Secondary | ICD-10-CM | POA: Diagnosis not present

## 2017-09-28 DIAGNOSIS — R69 Illness, unspecified: Secondary | ICD-10-CM | POA: Diagnosis not present

## 2017-09-30 DIAGNOSIS — Z794 Long term (current) use of insulin: Secondary | ICD-10-CM | POA: Diagnosis not present

## 2017-09-30 DIAGNOSIS — E1129 Type 2 diabetes mellitus with other diabetic kidney complication: Secondary | ICD-10-CM | POA: Diagnosis not present

## 2017-09-30 DIAGNOSIS — I1310 Hypertensive heart and chronic kidney disease without heart failure, with stage 1 through stage 4 chronic kidney disease, or unspecified chronic kidney disease: Secondary | ICD-10-CM | POA: Diagnosis not present

## 2017-09-30 DIAGNOSIS — C61 Malignant neoplasm of prostate: Secondary | ICD-10-CM | POA: Diagnosis not present

## 2017-09-30 DIAGNOSIS — E7800 Pure hypercholesterolemia, unspecified: Secondary | ICD-10-CM | POA: Diagnosis not present

## 2017-09-30 DIAGNOSIS — N182 Chronic kidney disease, stage 2 (mild): Secondary | ICD-10-CM | POA: Diagnosis not present

## 2017-09-30 DIAGNOSIS — E1151 Type 2 diabetes mellitus with diabetic peripheral angiopathy without gangrene: Secondary | ICD-10-CM | POA: Diagnosis not present

## 2017-09-30 DIAGNOSIS — I2510 Atherosclerotic heart disease of native coronary artery without angina pectoris: Secondary | ICD-10-CM | POA: Diagnosis not present

## 2017-09-30 DIAGNOSIS — I10 Essential (primary) hypertension: Secondary | ICD-10-CM | POA: Diagnosis not present

## 2017-09-30 DIAGNOSIS — I7389 Other specified peripheral vascular diseases: Secondary | ICD-10-CM | POA: Diagnosis not present

## 2017-12-16 DIAGNOSIS — E113292 Type 2 diabetes mellitus with mild nonproliferative diabetic retinopathy without macular edema, left eye: Secondary | ICD-10-CM | POA: Diagnosis not present

## 2017-12-16 DIAGNOSIS — H353122 Nonexudative age-related macular degeneration, left eye, intermediate dry stage: Secondary | ICD-10-CM | POA: Diagnosis not present

## 2017-12-16 DIAGNOSIS — H35033 Hypertensive retinopathy, bilateral: Secondary | ICD-10-CM | POA: Diagnosis not present

## 2017-12-16 DIAGNOSIS — I708 Atherosclerosis of other arteries: Secondary | ICD-10-CM | POA: Diagnosis not present

## 2017-12-22 DIAGNOSIS — R69 Illness, unspecified: Secondary | ICD-10-CM | POA: Diagnosis not present

## 2017-12-29 DIAGNOSIS — Z8546 Personal history of malignant neoplasm of prostate: Secondary | ICD-10-CM | POA: Diagnosis not present

## 2018-01-04 DIAGNOSIS — C7919 Secondary malignant neoplasm of other urinary organs: Secondary | ICD-10-CM | POA: Diagnosis not present

## 2018-01-04 DIAGNOSIS — C61 Malignant neoplasm of prostate: Secondary | ICD-10-CM | POA: Diagnosis not present

## 2018-04-13 DIAGNOSIS — E1151 Type 2 diabetes mellitus with diabetic peripheral angiopathy without gangrene: Secondary | ICD-10-CM | POA: Diagnosis not present

## 2018-04-15 DIAGNOSIS — E785 Hyperlipidemia, unspecified: Secondary | ICD-10-CM | POA: Diagnosis not present

## 2018-04-15 DIAGNOSIS — R972 Elevated prostate specific antigen [PSA]: Secondary | ICD-10-CM | POA: Diagnosis not present

## 2018-04-15 DIAGNOSIS — Z0000 Encounter for general adult medical examination without abnormal findings: Secondary | ICD-10-CM | POA: Diagnosis not present

## 2018-04-15 DIAGNOSIS — I10 Essential (primary) hypertension: Secondary | ICD-10-CM | POA: Diagnosis not present

## 2018-04-15 DIAGNOSIS — R82998 Other abnormal findings in urine: Secondary | ICD-10-CM | POA: Diagnosis not present

## 2018-04-20 DIAGNOSIS — E7800 Pure hypercholesterolemia, unspecified: Secondary | ICD-10-CM | POA: Diagnosis not present

## 2018-04-20 DIAGNOSIS — I1310 Hypertensive heart and chronic kidney disease without heart failure, with stage 1 through stage 4 chronic kidney disease, or unspecified chronic kidney disease: Secondary | ICD-10-CM | POA: Diagnosis not present

## 2018-04-20 DIAGNOSIS — Z23 Encounter for immunization: Secondary | ICD-10-CM | POA: Diagnosis not present

## 2018-04-20 DIAGNOSIS — Z794 Long term (current) use of insulin: Secondary | ICD-10-CM | POA: Diagnosis not present

## 2018-04-20 DIAGNOSIS — Z0000 Encounter for general adult medical examination without abnormal findings: Secondary | ICD-10-CM | POA: Diagnosis not present

## 2018-04-20 DIAGNOSIS — C61 Malignant neoplasm of prostate: Secondary | ICD-10-CM | POA: Diagnosis not present

## 2018-04-20 DIAGNOSIS — E1129 Type 2 diabetes mellitus with other diabetic kidney complication: Secondary | ICD-10-CM | POA: Diagnosis not present

## 2018-04-20 DIAGNOSIS — I2510 Atherosclerotic heart disease of native coronary artery without angina pectoris: Secondary | ICD-10-CM | POA: Diagnosis not present

## 2018-04-20 DIAGNOSIS — E1151 Type 2 diabetes mellitus with diabetic peripheral angiopathy without gangrene: Secondary | ICD-10-CM | POA: Diagnosis not present

## 2018-04-20 DIAGNOSIS — I7389 Other specified peripheral vascular diseases: Secondary | ICD-10-CM | POA: Diagnosis not present

## 2018-04-20 DIAGNOSIS — N182 Chronic kidney disease, stage 2 (mild): Secondary | ICD-10-CM | POA: Diagnosis not present

## 2018-06-03 ENCOUNTER — Other Ambulatory Visit: Payer: Self-pay | Admitting: Urology

## 2018-06-03 DIAGNOSIS — Z79899 Other long term (current) drug therapy: Secondary | ICD-10-CM

## 2018-06-22 DIAGNOSIS — R69 Illness, unspecified: Secondary | ICD-10-CM | POA: Diagnosis not present

## 2018-06-29 DIAGNOSIS — Z8546 Personal history of malignant neoplasm of prostate: Secondary | ICD-10-CM | POA: Diagnosis not present

## 2018-07-06 DIAGNOSIS — C61 Malignant neoplasm of prostate: Secondary | ICD-10-CM | POA: Diagnosis not present

## 2018-07-09 ENCOUNTER — Other Ambulatory Visit: Payer: Medicare HMO

## 2018-08-02 ENCOUNTER — Other Ambulatory Visit: Payer: Medicare HMO

## 2018-08-23 DIAGNOSIS — C61 Malignant neoplasm of prostate: Secondary | ICD-10-CM | POA: Diagnosis not present

## 2018-09-13 ENCOUNTER — Ambulatory Visit
Admission: EM | Admit: 2018-09-13 | Discharge: 2018-09-13 | Disposition: A | Discharge status: Home / Self Care | Payer: Medicare HMO | Attending: Family Medicine | Admitting: Family Medicine

## 2018-09-13 DIAGNOSIS — T7840XA Allergy, unspecified, initial encounter: Secondary | ICD-10-CM

## 2018-09-13 DIAGNOSIS — T63441A Toxic effect of venom of bees, accidental (unintentional), initial encounter: Secondary | ICD-10-CM | POA: Diagnosis not present

## 2018-09-13 MED ORDER — FAMOTIDINE 20 MG PO TABS: 20.0000 mg | ORAL_TABLET | Freq: Two times a day (BID) | ORAL | 0 refills | Status: DC

## 2018-09-13 MED ORDER — METHYLPREDNISOLONE SODIUM SUCC 125 MG IJ SOLR
125.0000 mg | Freq: Once | INTRAMUSCULAR | Status: AC
  Administered 2018-09-13: 125 mg via INTRAMUSCULAR

## 2018-09-13 MED ORDER — CETIRIZINE HCL 10 MG PO CAPS: 10.0000 mg | ORAL_CAPSULE | Freq: Every day | ORAL | 0 refills | Status: DC

## 2018-09-13 MED ORDER — DIPHENHYDRAMINE HCL 25 MG PO CAPS
25.0000 mg | ORAL_CAPSULE | Freq: Once | ORAL | Status: AC
  Administered 2018-09-13: 25 mg via ORAL

## 2018-09-13 MED ORDER — PREDNISONE 20 MG PO TABS: 20.0000 mg | ORAL_TABLET | Freq: Two times a day (BID) | ORAL | 0 refills | Status: AC

## 2018-09-13 MED ORDER — FAMOTIDINE 20 MG PO TABS
20.0000 mg | ORAL_TABLET | Freq: Once | ORAL | Status: AC
  Administered 2018-09-13: 20 mg via ORAL

## 2018-09-13 NOTE — ED Triage Notes (Signed)
Pt states stung by a yellow jacket 13mins ago to back of lt side of head. Swelling to rt eye, states hard to swallow, speaking in complete sentences

## 2018-09-13 NOTE — Discharge Instructions (Addendum)
We gave you Solu-Medrol, Benadryl and Pepcid  Please continue to use antihistamines like Zyrtec or Claritin in the morning, may use Benadryl to supplement, do not drive or work after taking Benadryl Please also use Pepcid as this will further help with any chest discomfort/increased acid production from allergic reaction May take prednisone 20 mg twice daily with food as needed for further swelling and itching over the next 3 to 4 days.  If symptoms completely resolved, you do not need to continue to take this medicine.  This medicine will cause her blood sugars to increase.  Please monitor.  Please follow-up here or in emergency room if developing worsening symptoms, shortness of breath, difficulty breathing, throat swelling

## 2018-09-13 NOTE — ED Provider Notes (Signed)
[facility]    CSN: [id number] Arrival date & time: 09/13/18  1152     History   Chief Complaint Chief Complaint  Patient presents with  . Insect Bite    HPI Ryan Saunders is a 73 y.o. male history of prostate cancer, CAD, hypertension, hyperlipidemia, DM type II, presenting today for evaluation of allergic reaction secondary to bee sting.  States he was stung by a yellow jacket approximately 45 minutes ago to his left posterior aspect of his head.  Since he has developed itching to his arms, legs, swelling to his left forearm and around his right eye.  He has had some slight discomfort with swallowing as well as in his sternum and feels as if there is a knot there.  He has had 1 previous reaction to bee stings which improved with steroid injections.  Denies history of anaphylaxis.  Did not use EpiPen.  He use Benadryl gel to the area of the sting as well as took ibuprofen.  He has felt that the swelling has improved over the past 10 minutes.  HPI  Past Medical History:  Diagnosis Date  . Cancer [facility])    PROSTATE CANCER  . Coronary artery disease   . Crohn's disease ([place])   . Hyperlipidemia   . Hypertension   . PVC (premature ventricular contraction)   . PVD (peripheral vascular disease) [facility])     Patient Active Problem List   Diagnosis Date Noted  . Palpitations 08/06/2015  . Malignant neoplasm of prostate ([place]) 02/08/2015  . CAD (coronary artery disease) 04/21/2014  . HTN (hypertension) 04/21/2014  . Hyperlipidemia 04/21/2014    Past Surgical History:  Procedure Laterality Date  . ANAL FISTULA REPAIR    . CRYOTHERAPY  2003   every 7 years  . INSERTION PROSTATE RADIATION SEED  2003  . [place] Medications    Prior to Admission medications   Medication Sig Start Date End Date Taking? Authorizing Provider  aspirin 81 MG tablet Take 81 mg by mouth daily.    [provider]  atorvastatin (LIPITOR) 80 MG tablet Take 80  mg by mouth daily. 12/28/08   [provider]  Cetirizine HCl 10 MG CAPS Take 1 capsule (10 mg total) by mouth daily for 10 days. 09/13/18 09/23/18  Jermery Caratachea C, PA-C  famotidine (PEPCID) 20 MG tablet Take 1 tablet (20 mg total) by mouth 2 (two) times daily. 09/13/18   Kayden Hutmacher C, PA-C  Leuprolide Acetate, 6 Month, (LUPRON DEPOT, 106-MONTH, IM) Inject 1 Dose into the muscle every 6 (six) months.    [provider]  LEVEMIR FLEXTOUCH 100 UNIT/ML Pen  11/06/14   [provider]  lisinopril (PRINIVIL,ZESTRIL) 40 MG tablet Take 20 mg by mouth daily.  01/04/10   [provider]  Multiple Vitamin (MULTIVITAMIN) tablet Take 1 tablet by mouth daily. AREDS    [provider]  predniSONE (DELTASONE) 20 MG tablet Take 1 tablet (20 mg total) by mouth 2 (two) times daily with a meal for 4 days. 09/13/18 09/17/18  Daiki Dicostanzo C, PA-C  VICTOZA 18 MG/3ML SOPN Inject into the skin daily. 02/06/14   [provider]  ZETIA 10 MG tablet Take 10 mg by mouth daily. 02/14/14   [provider]    Family History Family History  Problem Relation Age of Onset  . CVA Mother   . Heart attack Mother   . Crohn's disease Mother   .  Diabetes Father   . Heart failure Paternal Grandmother     Social History Social History   Tobacco Use  . Smoking status: Former Smoker    Packs/day: 2.00    Years: 50.00    Pack years: 100.00    Types: Cigarettes    Last attempt to quit: 02/08/2004    Years since quitting: 14.6  . Smokeless tobacco: Never Used  Substance Use Topics  . Alcohol use: Yes    Alcohol/week: 0.0 standard drinks    Comment: SOCAILLY  . Drug use: No     Allergies   Bee venom   Review of Systems Review of Systems  Constitutional: Negative for fatigue and fever.  HENT: Positive for facial swelling. Negative for mouth sores and sore throat.   Eyes: Negative for redness, itching and visual disturbance.  Respiratory: Negative for  shortness of breath.   Cardiovascular: Negative for chest pain and leg swelling.  Gastrointestinal: Negative for nausea and vomiting.  Musculoskeletal: Negative for arthralgias and myalgias.  Skin: Positive for color change and rash. Negative for wound.  Neurological: Negative for dizziness, syncope, weakness, light-headedness and headaches.     Physical Exam Triage Vital Signs ED Triage Vitals  Enc Vitals Group     BP      Pulse      Resp      Temp      Temp src      SpO2      Weight      Height      Head Circumference      Peak Flow      Pain Score      Pain Loc      Pain Edu?      Excl. in [place]?    No data found.  Updated Vital Signs BP (!) 154/72 (BP Location: Left Arm)   Pulse 62   Temp 97.9 F (36.6 C) (Oral)   Resp 18   SpO2 96%   Visual Acuity Right Eye Distance:   Left Eye Distance:   Bilateral Distance:    Right Eye Near:   Left Eye Near:    Bilateral Near:     Physical Exam Vitals signs and nursing note reviewed.  Constitutional:      Appearance: He is well-developed.     Comments: No acute distress  HENT:     Head: Normocephalic and atraumatic.     Nose: Nose normal.     Mouth/Throat:     Comments: Oral mucosa pink and moist, no tonsillar enlargement or exudate. Posterior pharynx patent and nonerythematous, no uvula deviation or swelling. Normal phonation. No soft palate swelling  No angioedema noted Eyes:     Conjunctiva/sclera: Conjunctivae normal.     Comments: Right infraorbital area with swelling  Neck:     Musculoskeletal: Neck supple.  Cardiovascular:     Rate and Rhythm: Normal rate.  Pulmonary:     Effort: Pulmonary effort is normal. No respiratory distress.     Comments: Breathing comfortably at rest, CTABL, no wheezing, rales or other adventitious sounds auscultated Abdominal:     General: There is no distension.  Musculoskeletal: Normal range of motion.  Skin:    General: Skin is warm and dry.     Comments: Left forearm  with swelling and erythema  Neurological:     Mental Status: He is alert and oriented to person, place, and time.      UC Treatments / Results  Labs (all labs ordered  are listed, but only abnormal results are displayed) Labs Reviewed - No data to display  EKG None  Radiology No results found.  Procedures Procedures (including critical care time)  Medications Ordered in UC Medications  methylPREDNISolone sodium succinate (SOLU-MEDROL) 125 mg/2 mL injection 125 mg (125 mg Intramuscular Given 09/13/18 1219)  famotidine (PEPCID) tablet 20 mg (20 mg Oral Given 09/13/18 1218)  diphenhydrAMINE (BENADRYL) capsule 25 mg (25 mg Oral Given 09/13/18 1218)    Initial Impression / Assessment and Plan / UC Course  I have reviewed the triage vital signs and the nursing notes.  Pertinent labs & imaging results that were available during my care of the patient were reviewed by me and considered in my medical decision making (see chart for details).     Solu-Medrol 125, Benadryl 25 mg p.o. and famotidine 20 mg p.o. given in clinic. Patient with patent airway, stable vital signs, no acute distress. Patient monitored for approximately 30-40 minutes after injections. No progression of symptoms, patient feeling improved.   Vital signs stable, symptoms not progressing, will have continue taking prednisone, antihistamines.  Advised to follow-up if symptoms returning or worsening, developing difficulty breathing or shortness of breath.Discussed strict return precautions. Patient verbalized understanding and is agreeable with plan.   Final Clinical Impressions(s) / UC Diagnoses   Final diagnoses:  Bee sting, accidental or unintentional, initial encounter  Allergic reaction, initial encounter     Discharge Instructions     We gave you Solu-Medrol, Benadryl and Pepcid  Please continue to use antihistamines like Zyrtec or Claritin in the morning, may use Benadryl to supplement, do not drive or work  after taking Benadryl Please also use Pepcid as this will further help with any chest discomfort/increased acid production from allergic reaction May take prednisone 20 mg twice daily with food as needed for further swelling and itching over the next 3 to 4 days.  If symptoms completely resolved, you do not need to continue to take this medicine.  This medicine will cause her blood sugars to increase.  Please monitor.  Please follow-up here or in emergency room if developing worsening symptoms, shortness of breath, difficulty breathing, throat swelling    ED Prescriptions    Medication Sig Dispense Auth. Provider   predniSONE (DELTASONE) 20 MG tablet Take 1 tablet (20 mg total) by mouth 2 (two) times daily with a meal for 4 days. 8 tablet Amous Crewe C, PA-C   Cetirizine HCl 10 MG CAPS Take 1 capsule (10 mg total) by mouth daily for 10 days. 10 capsule Tzippy Testerman C, PA-C   famotidine (PEPCID) 20 MG tablet Take 1 tablet (20 mg total) by mouth 2 (two) times daily. 30 tablet Shandi Godfrey, [place] C, PA-C     Controlled Substance Prescriptions [place] Controlled Substance Registry consulted? Not Applicable   Janith Lima, [place] 09/13/18 1250

## 2018-09-16 DIAGNOSIS — R69 Illness, unspecified: Secondary | ICD-10-CM | POA: Diagnosis not present

## 2018-09-21 ENCOUNTER — Telehealth: Payer: Self-pay | Admitting: *Deleted

## 2018-09-21 ENCOUNTER — Other Ambulatory Visit: Payer: Self-pay

## 2018-09-21 ENCOUNTER — Ambulatory Visit
Admission: RE | Admit: 2018-09-21 | Discharge: 2018-09-21 | Disposition: A | Discharge status: Home / Self Care | Payer: Medicare HMO | Source: Ambulatory Visit | Attending: Urology | Admitting: Urology

## 2018-09-21 DIAGNOSIS — Z13820 Encounter for screening for osteoporosis: Secondary | ICD-10-CM | POA: Diagnosis not present

## 2018-09-21 DIAGNOSIS — Z79899 Other long term (current) drug therapy: Secondary | ICD-10-CM

## 2018-09-21 NOTE — Telephone Encounter (Signed)
Spoke with pt and pt cosent to   IF USING DOXIMITY or [email or web address] - The patient will receive a link just prior to their visit by text.  Confirm consent - "In the setting of the current Covid19 crisis, you are scheduled for a (phone or video) visit with your provider on (date) at (time).  Just as we do with many in-office visits, in order for you to participate in this visit, we must obtain consent.  If you'd like, I can send this to your mychart (if signed up) or email for you to review.  Otherwise, I can obtain your verbal consent now.  All virtual visits are billed to your insurance company just like a normal visit would be.  By agreeing to a virtual visit, we'd like you to understand that the technology does not allow for your provider to perform an examination, and thus may limit your provider's ability to fully assess your condition. If your provider identifies any concerns that need to be evaluated in person, we will make arrangements to do so.  Finally, though the technology is pretty good, we cannot assure that it will always work on either your or our end, and in the setting of a video visit, we may have to convert it to a phone-only visit.  In either situation, we cannot ensure that we have a secure connection.  Are you willing to proceed?" STAFF: Did the patient verbally acknowledge consent to telehealth visit? Document YES/NO here:  Yes    TELEPHONE CALL NOTE  Ryan Saunders has been deemed a candidate for a follow-up tele-health visit to limit community exposure during the Covid-19 pandemic. I spoke with the patient via phone to ensure availability of phone/video source, confirm preferred email & phone number, and discuss instructions and expectations.  I reminded Ryan Saunders to be prepared with any vital sign and/or heart rhythm information that could potentially be obtained via home monitoring, at the time of his visit. I reminded Ryan Saunders to expect a phone call prior to his  visit.  Claude Manges, [place] 09/21/2018 4:15 PM   FULL LENGTH CONSENT FOR TELE-HEALTH VISIT   I hereby voluntarily request, consent and authorize [facility] and its employed or contracted physicians, physician assistants, nurse practitioners or other licensed health care professionals (the Practitioner), to provide me with telemedicine health care services (the Services") as deemed necessary by the treating Practitioner. I acknowledge and consent to receive the Services by the Practitioner via telemedicine. I understand that the telemedicine visit will involve communicating with the Practitioner through live audiovisual communication technology and the disclosure of certain medical information by electronic transmission. I acknowledge that I have been given the opportunity to request an in-person assessment or other available alternative prior to the telemedicine visit and am voluntarily participating in the telemedicine visit.  I understand that I have the right to withhold or withdraw my consent to the use of telemedicine in the course of my care at any time, without affecting my right to future care or treatment, and that the Practitioner or I may terminate the telemedicine visit at any time. I understand that I have the right to inspect all information obtained and/or recorded in the course of the telemedicine visit and may receive copies of available information for a reasonable fee.  I understand that some of the potential risks of receiving the Services via telemedicine include:   Delay or interruption in medical evaluation due to technological equipment failure or disruption;  Information transmitted may not be sufficient (e.g. poor resolution of images) to allow for appropriate medical decision making by the Practitioner; and/or   In rare instances, security protocols could fail, causing a breach of personal health information.  Furthermore, I acknowledge that it is my responsibility  to provide information about my medical history, conditions and care that is complete and accurate to the best of my ability. I acknowledge that Practitioner's advice, recommendations, and/or decision may be based on factors not within their control, such as incomplete or inaccurate data provided by me or distortions of diagnostic images or specimens that may result from electronic transmissions. I understand that the practice of medicine is not an exact science and that Practitioner makes no warranties or guarantees regarding treatment outcomes. I acknowledge that I will receive a copy of this consent concurrently upon execution via email to the email address I last provided but may also request a printed copy by calling the office of [place].    I understand that my insurance will be billed for this visit.   I have read or had this consent read to me.  I understand the contents of this consent, which adequately explains the benefits and risks of the Services being provided via telemedicine.   I have been provided ample opportunity to ask questions regarding this consent and the Services and have had my questions answered to my satisfaction.  I give my informed consent for the services to be provided through the use of telemedicine in my medical care  By participating in this telemedicine visit I agree to the above. \

## 2018-09-27 DIAGNOSIS — E119 Type 2 diabetes mellitus without complications: Secondary | ICD-10-CM | POA: Insufficient documentation

## 2018-09-27 NOTE — Progress Notes (Signed)
Virtual Visit via Video Note   This visit type was conducted due to national recommendations for restrictions regarding the COVID-19 Pandemic (e.g. social distancing) in an effort to limit this patient's exposure and mitigate transmission in our community.  Due to his co-morbid illnesses, this patient is at least at moderate risk for complications without adequate follow up.  This format is felt to be most appropriate for this patient at this time.  All issues noted in this document were discussed and addressed.  A limited physical exam was performed with this format.  Please refer to the patient's chart for his consent to telehealth for [facility].   Date:  09/28/2018   ID:  Ryan Saunders, DOB [birth year], MRN [id number]  Patient Location: Home Provider Location: Home  PCP:  Tisovec, Fransico Him, MD  Cardiologist:  Mertie Moores, MD   Electrophysiologist:  None   Evaluation Performed:  Follow-Up Visit  Chief Complaint: Follow-up on CAD  History of Present Illness:    Ryan Saunders is a 73 y.o. male with:  Coronary artery disease  Minimal LAD plaque on cardiac catheterization in 2008  right  bundle branch block   Diabetes mellitus  Hypertension  Hyperlipidemia  PVCs  Prostate cancer  S/p cryoablation, radiation implants; Lupron shots  He was last seen by Dr. Acie Fredrickson in June 2019.  Today, he is doing well.  He has not had any chest discomfort or significant shortness of breath.  He has not had any orthopnea, paroxysmal nocturnal dyspnea, leg swelling or syncope.  He has been trying to play golf 3 times a week.  He walks the course and has not had any significant limitation.  The patient does not have symptoms concerning for COVID-19 infection (fever, chills, cough, or new shortness of breath).    Past Medical History:  Diagnosis Date  . Cancer [facility]    PROSTATE CANCER  . Coronary artery disease   . Crohn's disease ([place])   . Hyperlipidemia   .  Hypertension   . PVC (premature ventricular contraction)   . PVD (peripheral vascular disease) ([place])    Past Surgical History:  Procedure Laterality Date  . ANAL FISTULA REPAIR    . CRYOTHERAPY  2003   every 7 years  . INSERTION PROSTATE RADIATION SEED  2003  . VASECTOMY  1972     Current Meds  Medication Sig  . atorvastatin (LIPITOR) 80 MG tablet Take 80 mg by mouth daily.  . B Complex Vitamins (B COMPLEX PO) Take 1 tablet by mouth daily.   Marland Kitchen CALCIUM PO Take 1 tablet by mouth daily.  Marland Kitchen Leuprolide Acetate, 6 Month, (LUPRON DEPOT, 50-MONTH, IM) Inject 1 Dose into the muscle every 6 (six) months.  Ernest Mallick FLEXTOUCH 100 UNIT/ML Pen Inject 50 Units into the skin daily.   Marland Kitchen lisinopril (PRINIVIL,ZESTRIL) 40 MG tablet Take 20 mg by mouth daily.   . Multiple Vitamin (MULTIVITAMIN) tablet Take 1 tablet by mouth daily. AREDS  . VICTOZA 18 MG/3ML SOPN Inject 18 mg into the skin daily.   Marland Kitchen ZETIA 10 MG tablet Take 10 mg by mouth daily.     Allergies:   Bee venom   Social History   Tobacco Use  . Smoking status: Former Smoker    Packs/day: 2.00    Years: 50.00    Pack years: 100.00    Types: Cigarettes    Quit date: 02/08/2004    Years since quitting: 14.6  . Smokeless tobacco: Never Used  Substance Use Topics  . Alcohol use: Yes    Alcohol/week: 0.0 standard drinks    Comment: SOCAILLY  . Drug use: No     Family Hx: The patient's family history includes CVA in his mother; Crohn's disease in his mother; Diabetes in his father; Heart attack in his mother; Heart failure in his paternal grandmother.  ROS:   Please see the history of present illness.     All other systems reviewed and are negative.   Prior CV studies:   The following studies were reviewed today:  Cardiac catheterization 09/23/2006 LM normal LAD irregularities LCx normal RCA normal EF 60   Labs/Other Tests and Data Reviewed:    EKG:  No ECG reviewed.  Recent Labs: No results found for requested  labs within last 8760 hours.   Recent Lipid Panel Lab Results  Component Value Date/Time   CHOL 140 01/18/2015 04:27 PM   TRIG 108 01/18/2015 04:27 PM   HDL 40 01/18/2015 04:27 PM   CHOLHDL 3.5 01/18/2015 04:27 PM   LDLCALC 78 01/18/2015 04:27 PM     Wt Readings from Last 3 Encounters:  09/28/18 185 lb (83.9 kg)  09/25/17 194 lb 6.4 oz (88.2 kg)  08/07/16 201 lb 9.6 oz (91.4 kg)     Objective:    Vital Signs:  BP 132/75   Pulse 65   Ht 5' 10.5" (1.791 m)   Wt 185 lb (83.9 kg)   BMI 26.17 kg/m    VITAL SIGNS:  reviewed GEN:  no acute distress EYES:  Sclera anicteric RESPIRATORY:  Normal respiratory effort NEURO:  Alert and oriented PSYCH:  normal affect  ASSESSMENT & PLAN:     Coronary artery disease involving native coronary artery of native heart without angina pectoris - Plan: Minimal plaque on cardiac catheterization in 2008.  He is not having anginal symptoms.  Continue current medical regimen.  Essential hypertension - Plan: The patient's blood pressure is controlled on his current regimen.  Continue current therapy.   Hyperlipidemia, unspecified hyperlipidemia type - Plan: LDL optimal on most recent lab work.  Continue current Rx.    Type 2 diabetes mellitus without complication, with long-term current use of insulin ([place]) - Plan: Recent A1c 6.8.  Good control.  Continue follow-up with primary care.  COVID-19 Education: The signs and symptoms of COVID-19 were discussed with the patient and how to seek care for testing (follow up with PCP or arrange E-visit).  The importance of social distancing was discussed today.  Time:   Today, I have spent 9 minutes with the patient with telehealth technology discussing the above problems.     Medication Adjustments/Labs and Tests Ordered: Current medicines are reviewed at length with the patient today.  Concerns regarding medicines are outlined above.   Tests Ordered: No orders of the defined types were placed in  this encounter.   Medication Changes: No orders of the defined types were placed in this encounter.   Follow Up:  In Person in 1 year(s) With Dr. Acie Fredrickson  Signed, Richardson Dopp, PA-C  09/28/2018 10:34 AM    [place]

## 2018-09-28 ENCOUNTER — Other Ambulatory Visit: Payer: Self-pay

## 2018-09-28 ENCOUNTER — Encounter: Payer: Self-pay | Admitting: Physician Assistant

## 2018-09-28 ENCOUNTER — Telehealth (INDEPENDENT_AMBULATORY_CARE_PROVIDER_SITE_OTHER): Payer: Medicare HMO | Admitting: Physician Assistant

## 2018-09-28 VITALS — BP 132/75 | HR 65 | Ht 70.5 in | Wt 185.0 lb

## 2018-09-28 DIAGNOSIS — I10 Essential (primary) hypertension: Secondary | ICD-10-CM

## 2018-09-28 DIAGNOSIS — E119 Type 2 diabetes mellitus without complications: Secondary | ICD-10-CM

## 2018-09-28 DIAGNOSIS — Z7189 Other specified counseling: Secondary | ICD-10-CM

## 2018-09-28 DIAGNOSIS — Z794 Long term (current) use of insulin: Secondary | ICD-10-CM

## 2018-09-28 DIAGNOSIS — E785 Hyperlipidemia, unspecified: Secondary | ICD-10-CM

## 2018-09-28 DIAGNOSIS — I2510 Atherosclerotic heart disease of native coronary artery without angina pectoris: Secondary | ICD-10-CM | POA: Diagnosis not present

## 2018-09-28 NOTE — Patient Instructions (Signed)
Medication Instructions:   Your physician recommends that you continue on your current medications as directed. Please refer to the Current Medication list given to you today.   If you need a refill on your cardiac medications before your next appointment, please call your pharmacy.   Lab work: NONE ORDERED  TODAY   If you have labs (blood work) drawn today and your tests are completely normal, you will receive your results only by: Marland Kitchen MyChart Message (if you have MyChart) OR . A paper copy in the mail If you have any lab test that is abnormal or we need to change your treatment, we will call you to review the results.  Testing/Procedures: NONE ORDERED  TODAY    Follow-Up: At [facility], you and your health needs are our priority.  As part of our continuing mission to provide you with exceptional heart care, we have created designated Provider Care Teams.  These Care Teams include your primary Cardiologist (physician) and Advanced Practice Providers (APPs -  Physician Assistants and Nurse Practitioners) who all work together to provide you with the care you need, when you need it. You will need a follow up appointment in:  1 years.  Please call our office 2 months in advance to schedule this appointment.  You may see Mertie Moores, MD or one of the following Advanced Practice Providers on your designated Care Team: Richardson Dopp, PA-C [place], [place] . Daune Perch, NP  Any Other Special Instructions Will Be Listed Below (If Applicable).  \

## 2018-10-11 ENCOUNTER — Other Ambulatory Visit: Payer: Self-pay

## 2018-10-11 ENCOUNTER — Encounter: Payer: Self-pay | Admitting: Podiatry

## 2018-10-11 ENCOUNTER — Ambulatory Visit: Payer: Medicare HMO | Admitting: Podiatry

## 2018-10-11 VITALS — BP 135/67 | Temp 97.6°F

## 2018-10-11 DIAGNOSIS — B351 Tinea unguium: Secondary | ICD-10-CM | POA: Diagnosis not present

## 2018-10-11 DIAGNOSIS — M79675 Pain in left toe(s): Secondary | ICD-10-CM

## 2018-10-11 DIAGNOSIS — M79674 Pain in right toe(s): Secondary | ICD-10-CM | POA: Diagnosis not present

## 2018-10-11 DIAGNOSIS — R040 Epistaxis: Secondary | ICD-10-CM | POA: Insufficient documentation

## 2018-10-11 DIAGNOSIS — E119 Type 2 diabetes mellitus without complications: Secondary | ICD-10-CM | POA: Diagnosis not present

## 2018-10-11 NOTE — Progress Notes (Signed)
Subjective: Ryan Saunders presents today referred by Haywood Pao, MD for diabetic foot evaluation. He presents with cc of painful, discolored, thick toenails which interfere with daily activities.  Pain is aggravated when wearing enclosed shoe gear.   He relates history of diabetes x 20 years. He denies any h/o foot wounds. He denies any numbness, tingling, burning or pins/needles sensation in his feet.  Past Medical History:  Diagnosis Date  . Cancer [facility])    PROSTATE CANCER  . Coronary artery disease   . Crohn's disease ([place])   . Hyperlipidemia   . Hypertension   . PVC (premature ventricular contraction)   . PVD (peripheral vascular disease) [facility])      Patient Active Problem List   Diagnosis Date Noted  . Bleeding from the nose 10/11/2018  . Type 2 diabetes mellitus without complication ([place]) 01/77/9390  . Palpitations 08/06/2015  . Malignant neoplasm of prostate ([place]) 02/08/2015  . CAD (coronary artery disease) 04/21/2014  . HTN (hypertension) 04/21/2014  . Hyperlipidemia 04/21/2014  . Angiopathy, peripheral ([place]) 03/14/2014  . Chronic kidney disease, stage II (mild) 03/14/2014  . Benign neoplasm of colon 09/21/2013  . Microalbuminuria 03/01/2013  . Diabetes mellitus type 2, uncontrolled ([place]) 08/16/2010  . H/O: substance abuse ([place]) 02/11/2010  . Health examination of defined subpopulation 02/08/2009  . Benign essential HTN 12/28/2008  . CD (Crohn's disease) ([place]) 12/28/2008  . 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 ANAL FISTULA REPAIR    . CRYOTHERAPY  2003   every 7 years  . INSERTION PROSTATE RADIATION SEED  2003  . VASECTOMY  1972      Current Outpatient Medications:  .  atorvastatin (LIPITOR) 80 MG tablet, Take 80 mg by mouth daily., Disp: , Rfl:  .  B Complex Vitamins (B COMPLEX PO), Take 1 tablet by mouth daily. , Disp: , Rfl:  .  BD PEN NEEDLE NANO U/F 32G X 4 MM MISC, USE TO INJECT INSULIN  TWICE DAILY, Disp: , Rfl:  .  CALCIUM PO, Take 1 tablet by mouth daily., Disp: , Rfl:  .  cetirizine (ZYRTEC) 10 MG tablet, TAKE 1 TABLET BY MOUTH EVERY DAY X 10 DAYS, Disp: , Rfl:  .  Leuprolide Acetate, 6 Month, (LUPRON DEPOT, 12-MONTH, IM), Inject 1 Dose into the muscle every 6 (six) months., Disp: , Rfl:  .  LEVEMIR FLEXTOUCH 100 UNIT/ML Pen, Inject 50 Units into the skin daily. , Disp: , Rfl:  .  lisinopril (PRINIVIL,ZESTRIL) 40 MG tablet, Take 20 mg by mouth daily. , Disp: , Rfl:  .  Multiple Vitamin (MULTIVITAMIN) tablet, Take 1 tablet by mouth daily. AREDS, Disp: , Rfl:  .  ONETOUCH VERIO test strip, 2 (two) times daily. test blood sugar, Disp: , Rfl:  .  VICTOZA 18 MG/3ML SOPN, Inject 18 mg into the skin daily. , Disp: , Rfl: 6 .  ZETIA 10 MG tablet, Take 10 mg by mouth daily., Disp: , Rfl: 3   Allergies  Allergen Reactions  . Bee Venom     Other reaction(s): swelling in lips and mouth     Social History   Occupational History  . Occupation: retired  Tobacco Use  . Smoking status: Former Smoker    Packs/day: 2.00    Years: 50.00    Pack years: 100.00    Types: Cigarettes    Quit date: 02/08/2004    Years since quitting: 14.6  . Smokeless tobacco:  Never Used  Substance and Sexual Activity  . Alcohol use: Yes    Alcohol/week: 0.0 standard drinks    Comment: [place]  . Drug use: No  . Sexual activity: Not on file     Family History  Problem Relation Age of Onset  . CVA Mother   . Heart attack Mother   . Crohn's disease Mother   . Diabetes Father   . Heart failure Paternal Grandmother       There is no immunization history on file for this patient.   Review of systems: Positive Findings in bold print.  Constitutional:  chills, fatigue, fever, sweats, weight change Communication: Optometrist, sign Ecologist, hand writing, iPad/Android device Head: headaches, head injury Eyes: changes in vision, eye pain, glaucoma, cataracts, macular degeneration,  diplopia, glare,  light sensitivity, eyeglasses or contacts, blindness Ears nose mouth throat: hearing impaired, hearing aids,  ringing in ears, deaf, sign language,  vertigo,   nosebleeds,  rhinitis,  cold sores, snoring, swollen glands Cardiovascular: HTN, edema, arrhythmia, pacemaker in place, defibrillator in place, chest pain/tightness, chronic anticoagulation, blood clot, heart failure, MI Peripheral Vascular: leg cramps, varicose veins, blood clots, lymphedema, varicosities Respiratory:  difficulty breathing, denies congestion, SOB, wheezing, cough, emphysema Gastrointestinal: change in appetite or weight, abdominal pain, constipation, diarrhea, nausea, vomiting, vomiting blood, change in bowel habits, abdominal pain, jaundice, rectal bleeding, hemorrhoids, GERD Genitourinary:  nocturia,  pain on urination, polyuria,  blood in urine, Foley catheter, urinary urgency, ESRD on hemodialysis Musculoskeletal: amputation, cramping, stiff joints, painful joints, decreased joint motion, fractures, OA, gout, hemiplegia, paraplegia, uses cane, wheelchair bound, uses walker, uses rollator Skin: +changes in toenails, color change, dryness, itching, mole changes,  rash, wound(s) Neurological: headaches, numbness in feet, paresthesias in feet, burning in feet, fainting,  seizures, change in speech. denies headaches, memory problems/poor historian, cerebral palsy, weakness, paralysis, CVA, TIA Endocrine: diabetes, hypothyroidism, hyperthyroidism,  goiter, dry mouth, flushing, heat intolerance,  cold intolerance,  excessive thirst, denies polyuria,  nocturia Hematological:  easy bleeding, excessive bleeding, easy bruising, enlarged lymph nodes, on long term blood thinner, history of past transusions Allergy/immunological:  hives, eczema, frequent infections, multiple drug allergies, seasonal allergies, transplant recipient, multiple food allergies Psychiatric:  anxiety, depression, mood disorder, suicidal  ideations, hallucinations, insomnia  Objective: Vitals:   10/11/18 1328  BP: 135/67  Temp: 97.6 F (36.4 C)    Vascular Examination: Capillary refill time immediate x 10 digits.  Dorsalis pedis pulses palpable b/l.   Posterior tibial pulses palpable b/l.   Digital hair sparse x 10 digits.  Skin temperature gradient WNL b/l  Dermatological Examination: Skin with mild atrophy noted dorsally b/l.  Toenails 1-5 right, 2-5 left discolored, thick, dystrophic with subungual debris and pain with palpation to nailbeds due to thickness of nails.  Anonychia left great toe(s) with evidence of permanent total nail avulsion. Nailbed(s) completely epithelialized and intact.  Musculoskeletal: Muscle strength 5/5 to all LE muscle groups.  HAV with bunion b/l.  Elongated 2nd digits b/l.  Neurological: Sensation intact with 10 gram monofilament.  Vibratory sensation intact.  Assessment: 1. Painful onychomycosis toenails  1-5 right, 2-5 left  2. HAV with bunion b/l 3. Elongated 2nd digits 4. NIDDM  Plan: 1. Encounter for diabetic foot examination performed on today. 2. Toenails 1-5 right, 2-5 left were debrided in length and girth without iatrogenic bleeding. 3. Patient to continue soft, supportive shoe gear daily. 4. Patient to report any pedal injuries to medical professional immediately. 5. Follow up 3 months.  6.  Patient/POA to call should there be a concern in the interim.

## 2018-10-11 NOTE — Patient Instructions (Signed)
Diabetes Mellitus and Foot Care Foot care is an important part of your health, especially when you have diabetes. Diabetes may cause you to have problems because of poor blood flow (circulation) to your feet and legs, which can cause your skin to:  Become thinner and drier.  Break more easily.  Heal more slowly.  Peel and crack. You may also have nerve damage (neuropathy) in your legs and feet, causing decreased feeling in them. This means that you may not notice minor injuries to your feet that could lead to more serious problems. Noticing and addressing any potential problems early is the best way to prevent future foot problems. How to care for your feet Foot hygiene  Wash your feet daily with warm water and mild soap. Do not use hot water. Then, pat your feet and the areas between your toes until they are completely dry. Do not soak your feet as this can dry your skin.  Trim your toenails straight across. Do not dig under them or around the cuticle. File the edges of your nails with an emery board or nail file.  Apply a moisturizing lotion or petroleum jelly to the skin on your feet and to dry, brittle toenails. Use lotion that does not contain alcohol and is unscented. Do not apply lotion between your toes. Shoes and socks  Wear clean socks or stockings every day. Make sure they are not too tight. Do not wear knee-high stockings since they may decrease blood flow to your legs.  Wear shoes that fit properly and have enough cushioning. Always look in your shoes before you put them on to be sure there are no objects inside.  To break in new shoes, wear them for just a few hours a day. This prevents injuries on your feet. Wounds, scrapes, corns, and calluses  Check your feet daily for blisters, cuts, bruises, sores, and redness. If you cannot see the bottom of your feet, use a mirror or ask someone for help.  Do not cut corns or calluses or try to remove them with medicine.  If you  find a minor scrape, cut, or break in the skin on your feet, keep it and the skin around it clean and dry. You may clean these areas with mild soap and water. Do not clean the area with peroxide, alcohol, or iodine.  If you have a wound, scrape, corn, or callus on your foot, look at it several times a day to make sure it is healing and not infected. Check for: ? Redness, swelling, or pain. ? Fluid or blood. ? Warmth. ? Pus or a bad smell. General instructions  Do not cross your legs. This may decrease blood flow to your feet.  Do not use heating pads or hot water bottles on your feet. They may burn your skin. If you have lost feeling in your feet or legs, you may not know this is happening until it is too late.  Protect your feet from hot and cold by wearing shoes, such as at the beach or on hot pavement.  Schedule a complete foot exam at least once a year (annually) or more often if you have foot problems. If you have foot problems, report any cuts, sores, or bruises to your health care provider immediately. Contact a health care provider if:  You have a medical condition that increases your risk of infection and you have any cuts, sores, or bruises on your feet.  You have an injury that is not   healing.  You have redness on your legs or feet.  You feel burning or tingling in your legs or feet.  You have pain or cramps in your legs and feet.  Your legs or feet are numb.  Your feet always feel cold.  You have pain around a toenail. Get help right away if:  You have a wound, scrape, corn, or callus on your foot and: ? You have pain, swelling, or redness that gets worse. ? You have fluid or blood coming from the wound, scrape, corn, or callus. ? Your wound, scrape, corn, or callus feels warm to the touch. ? You have pus or a bad smell coming from the wound, scrape, corn, or callus. ? You have a fever. ? You have a red line going up your leg. Summary  Check your feet every day  for cuts, sores, red spots, swelling, and blisters.  Moisturize feet and legs daily.  Wear shoes that fit properly and have enough cushioning.  If you have foot problems, report any cuts, sores, or bruises to your health care provider immediately.  Schedule a complete foot exam at least once a year (annually) or more often if you have foot problems. This information is not intended to replace advice given to you by your health care provider. Make sure you discuss any questions you have with your health care provider. Document Released: 03/28/2000 Document Revised: 05/13/2017 Document Reviewed: 05/02/2016 Elsevier Patient Education  2020 Elsevier Inc.  

## 2018-10-13 ENCOUNTER — Ambulatory Visit: Payer: Medicare HMO | Admitting: Podiatry

## 2018-10-20 DIAGNOSIS — R809 Proteinuria, unspecified: Secondary | ICD-10-CM | POA: Diagnosis not present

## 2018-10-20 DIAGNOSIS — Z794 Long term (current) use of insulin: Secondary | ICD-10-CM | POA: Diagnosis not present

## 2018-10-20 DIAGNOSIS — N182 Chronic kidney disease, stage 2 (mild): Secondary | ICD-10-CM | POA: Diagnosis not present

## 2018-10-20 DIAGNOSIS — I2510 Atherosclerotic heart disease of native coronary artery without angina pectoris: Secondary | ICD-10-CM | POA: Diagnosis not present

## 2018-10-20 DIAGNOSIS — E7800 Pure hypercholesterolemia, unspecified: Secondary | ICD-10-CM | POA: Diagnosis not present

## 2018-10-20 DIAGNOSIS — E1129 Type 2 diabetes mellitus with other diabetic kidney complication: Secondary | ICD-10-CM | POA: Diagnosis not present

## 2018-10-20 DIAGNOSIS — I739 Peripheral vascular disease, unspecified: Secondary | ICD-10-CM | POA: Diagnosis not present

## 2018-10-20 DIAGNOSIS — E1151 Type 2 diabetes mellitus with diabetic peripheral angiopathy without gangrene: Secondary | ICD-10-CM | POA: Diagnosis not present

## 2018-10-20 DIAGNOSIS — C61 Malignant neoplasm of prostate: Secondary | ICD-10-CM | POA: Diagnosis not present

## 2018-10-20 DIAGNOSIS — I1310 Hypertensive heart and chronic kidney disease without heart failure, with stage 1 through stage 4 chronic kidney disease, or unspecified chronic kidney disease: Secondary | ICD-10-CM | POA: Diagnosis not present

## 2018-11-23 DIAGNOSIS — C61 Malignant neoplasm of prostate: Secondary | ICD-10-CM | POA: Diagnosis not present

## 2018-11-23 DIAGNOSIS — E559 Vitamin D deficiency, unspecified: Secondary | ICD-10-CM | POA: Diagnosis not present

## 2018-12-01 DIAGNOSIS — R69 Illness, unspecified: Secondary | ICD-10-CM | POA: Diagnosis not present

## 2018-12-26 DIAGNOSIS — R69 Illness, unspecified: Secondary | ICD-10-CM | POA: Diagnosis not present

## 2019-01-06 DIAGNOSIS — M8580 Other specified disorders of bone density and structure, unspecified site: Secondary | ICD-10-CM | POA: Diagnosis not present

## 2019-01-06 DIAGNOSIS — C7919 Secondary malignant neoplasm of other urinary organs: Secondary | ICD-10-CM | POA: Diagnosis not present

## 2019-01-06 DIAGNOSIS — N130 Hydronephrosis with ureteropelvic junction obstruction: Secondary | ICD-10-CM | POA: Diagnosis not present

## 2019-01-06 DIAGNOSIS — C61 Malignant neoplasm of prostate: Secondary | ICD-10-CM | POA: Diagnosis not present

## 2019-01-12 ENCOUNTER — Encounter: Payer: Self-pay | Admitting: Podiatry

## 2019-01-12 ENCOUNTER — Other Ambulatory Visit: Payer: Self-pay

## 2019-01-12 ENCOUNTER — Ambulatory Visit: Payer: Medicare HMO | Admitting: Podiatry

## 2019-01-12 DIAGNOSIS — E119 Type 2 diabetes mellitus without complications: Secondary | ICD-10-CM | POA: Diagnosis not present

## 2019-01-12 DIAGNOSIS — B351 Tinea unguium: Secondary | ICD-10-CM | POA: Diagnosis not present

## 2019-01-12 DIAGNOSIS — M79675 Pain in left toe(s): Secondary | ICD-10-CM

## 2019-01-12 DIAGNOSIS — M79674 Pain in right toe(s): Secondary | ICD-10-CM

## 2019-01-12 NOTE — Progress Notes (Signed)
Subjective: Ryan Saunders is seen today for follow up painful, elongated, thickened toenails 1-5 b/l feet that he cannot cut. Pain interferes with daily activities. Aggravating factor includes wearing enclosed shoe gear and relieved with periodic debridement.  Current Outpatient Medications on File Prior to Visit  Medication Sig  . atorvastatin (LIPITOR) 80 MG tablet Take 80 mg by mouth daily.  . B Complex Vitamins (B COMPLEX PO) Take 1 tablet by mouth daily.   . BD PEN NEEDLE NANO U/F 32G X 4 MM MISC USE TO INJECT INSULIN TWICE DAILY  . CALCIUM PO Take 1 tablet by mouth daily.  . cetirizine (ZYRTEC) 10 MG tablet TAKE 1 TABLET BY MOUTH EVERY DAY X 10 DAYS  . Leuprolide Acetate, 6 Month, (LUPRON DEPOT, 45-MONTH, IM) Inject 1 Dose into the muscle every 6 (six) months.  Ernest Mallick FLEXTOUCH 100 UNIT/ML Pen Inject 50 Units into the skin daily.   Marland Kitchen lisinopril (PRINIVIL,ZESTRIL) 40 MG tablet Take 20 mg by mouth daily.   Marland Kitchen lisinopril (ZESTRIL) 20 MG tablet Take 20 mg by mouth daily.  . Multiple Vitamin (MULTIVITAMIN) tablet Take 1 tablet by mouth daily. AREDS  . ONETOUCH VERIO test strip 2 (two) times daily. test blood sugar  . VICTOZA 18 MG/3ML SOPN Inject 18 mg into the skin daily.   Marland Kitchen ZETIA 10 MG tablet Take 10 mg by mouth daily.   No current facility-administered medications on file prior to visit.      Allergies  Allergen Reactions  . Bee Venom     Other reaction(s): swelling in lips and mouth     Objective:  Vascular Examination: Capillary refill time immediate x 10 digits.  Dorsalis pedis present b/l.  Posterior tibial pulses present b/l.  Digital hair sparse b/l.  Skin temperature gradient WNL b/l.   Dermatological Examination: Skin with mild atrophy b/l.  Toenails right great toe and 2-5 b/l discolored, thick, dystrophic with subungual debris and pain with palpation to nailbeds due to thickness of nails.  Anonychia left great toe with evidence of permanent total nail  avulsion. Nailbed(s) completely epithelialized and intact.  Musculoskeletal: Muscle strength 5/5 to all LE muscle groups  HAV with bunion b/l.   Elongated 2nd digits b/l.  No pain, crepitus or joint limitation noted with ROM.   Neurological Examination: Protective sensation intact with 10 gram monofilament bilaterally.  Epicritic sensation present bilaterally.  Vibratory sensation intact bilaterally.   Assessment: Painful onychomycosis toenails 1-5 b/l  NIDDM  Plan: 1. Toenails 1-5 b/l were debrided in length and girth without iatrogenic bleeding. 2. Patient to continue soft, supportive shoe gear 3. Patient to report any pedal injuries to medical professional immediately. 4. Follow up 3 months.  5. Patient/POA to call should there be a concern in the interim.

## 2019-01-12 NOTE — Patient Instructions (Signed)

## 2019-03-23 DIAGNOSIS — C7919 Secondary malignant neoplasm of other urinary organs: Secondary | ICD-10-CM | POA: Diagnosis not present

## 2019-03-23 DIAGNOSIS — C61 Malignant neoplasm of prostate: Secondary | ICD-10-CM | POA: Diagnosis not present

## 2019-03-31 DIAGNOSIS — C61 Malignant neoplasm of prostate: Secondary | ICD-10-CM | POA: Diagnosis not present

## 2019-03-31 DIAGNOSIS — C7919 Secondary malignant neoplasm of other urinary organs: Secondary | ICD-10-CM | POA: Diagnosis not present

## 2019-03-31 DIAGNOSIS — N130 Hydronephrosis with ureteropelvic junction obstruction: Secondary | ICD-10-CM | POA: Diagnosis not present

## 2019-03-31 DIAGNOSIS — M8580 Other specified disorders of bone density and structure, unspecified site: Secondary | ICD-10-CM | POA: Diagnosis not present

## 2019-04-20 ENCOUNTER — Ambulatory Visit: Payer: Medicare Other | Admitting: Podiatry

## 2019-04-20 ENCOUNTER — Other Ambulatory Visit: Payer: Self-pay

## 2019-04-20 ENCOUNTER — Encounter: Payer: Self-pay | Admitting: Podiatry

## 2019-04-20 DIAGNOSIS — M79675 Pain in left toe(s): Secondary | ICD-10-CM

## 2019-04-20 DIAGNOSIS — E119 Type 2 diabetes mellitus without complications: Secondary | ICD-10-CM | POA: Diagnosis not present

## 2019-04-20 DIAGNOSIS — B351 Tinea unguium: Secondary | ICD-10-CM

## 2019-04-20 DIAGNOSIS — M79674 Pain in right toe(s): Secondary | ICD-10-CM | POA: Diagnosis not present

## 2019-04-20 NOTE — Progress Notes (Signed)
Subjective: Ryan Saunders is a 74 y.o. y.o. male with h/o diabetes who presents today for preventative diabetic foot care. Patient has painful, elongated mycotic toenails which interfere with daily activities. Pain is aggravated when wearing enclosed shoe gear and relieved with periodic professional debridement.  Tisovec, Fransico Him, MD is patient's PCP.   Medications reviewed in chart.  Allergies  Allergen Reactions  . Bee Venom     Other reaction(s): swelling in lips and mouth   Objective: There were no vitals filed for this visit.  Vascular Examination: Capillary refill time immediate x 10 digits.  Dorsalis pedis present b/l.  Posterior tibial pulses present b/l.  Digital hair sparse b/l.  Skin temperature gradient WNL b/l.  Dermatological Examination: Skin with mild atrophy noted b/l.  Toenails 2-5 b/l and right great toe discolored, thick, dystrophic with subungual debris and pain with palpation to nailbeds due to thickness of nails.  Musculoskeletal: Muscle strength 5/5 to all LE muscle groups b/l.  HAV with bunion b/l. Elongated 2nd digits b/l.  Neurological: Sensation intact 5/5 b/l with 10 gram monofilament.  Vibratory sensation intact b/l.  Assessment: 1. Painful onychomycosis toenails 1-5 b/l 2.  NIDDM  Plan: 1. No new findings. No new orders on today's visit. 2. Continue diabetic foot care principles. Literature dispensed on today. 3. Toenails 1-5 b/l were debrided in length and girth without iatrogenic bleeding. 4. Patient to continue soft, supportive shoe gear daily. 5. Patient to report any pedal injuries to medical professional immediately. 6. Follow up 3 months.  7. Patient/POA to call should there be a concern in the interim.

## 2019-04-20 NOTE — Patient Instructions (Signed)
Diabetes Mellitus and Foot Care Foot care is an important part of your health, especially when you have diabetes. Diabetes may cause you to have problems because of poor blood flow (circulation) to your feet and legs, which can cause your skin to:  Become thinner and drier.  Break more easily.  Heal more slowly.  Peel and crack. You may also have nerve damage (neuropathy) in your legs and feet, causing decreased feeling in them. This means that you may not notice minor injuries to your feet that could lead to more serious problems. Noticing and addressing any potential problems early is the best way to prevent future foot problems. How to care for your feet Foot hygiene  Wash your feet daily with warm water and mild soap. Do not use hot water. Then, pat your feet and the areas between your toes until they are completely dry. Do not soak your feet as this can dry your skin.  Trim your toenails straight across. Do not dig under them or around the cuticle. File the edges of your nails with an emery board or nail file.  Apply a moisturizing lotion or petroleum jelly to the skin on your feet and to dry, brittle toenails. Use lotion that does not contain alcohol and is unscented. Do not apply lotion between your toes. Shoes and socks  Wear clean socks or stockings every day. Make sure they are not too tight. Do not wear knee-high stockings since they may decrease blood flow to your legs.  Wear shoes that fit properly and have enough cushioning. Always look in your shoes before you put them on to be sure there are no objects inside.  To break in new shoes, wear them for just a few hours a day. This prevents injuries on your feet. Wounds, scrapes, corns, and calluses  Check your feet daily for blisters, cuts, bruises, sores, and redness. If you cannot see the bottom of your feet, use a mirror or ask someone for help.  Do not cut corns or calluses or try to remove them with medicine.  If you  find a minor scrape, cut, or break in the skin on your feet, keep it and the skin around it clean and dry. You may clean these areas with mild soap and water. Do not clean the area with peroxide, alcohol, or iodine.  If you have a wound, scrape, corn, or callus on your foot, look at it several times a day to make sure it is healing and not infected. Check for: ? Redness, swelling, or pain. ? Fluid or blood. ? Warmth. ? Pus or a bad smell. General instructions  Do not cross your legs. This may decrease blood flow to your feet.  Do not use heating pads or hot water bottles on your feet. They may burn your skin. If you have lost feeling in your feet or legs, you may not know this is happening until it is too late.  Protect your feet from hot and cold by wearing shoes, such as at the beach or on hot pavement.  Schedule a complete foot exam at least once a year (annually) or more often if you have foot problems. If you have foot problems, report any cuts, sores, or bruises to your health care provider immediately. Contact a health care provider if:  You have a medical condition that increases your risk of infection and you have any cuts, sores, or bruises on your feet.  You have an injury that is not   healing.  You have redness on your legs or feet.  You feel burning or tingling in your legs or feet.  You have pain or cramps in your legs and feet.  Your legs or feet are numb.  Your feet always feel cold.  You have pain around a toenail. Get help right away if:  You have a wound, scrape, corn, or callus on your foot and: ? You have pain, swelling, or redness that gets worse. ? You have fluid or blood coming from the wound, scrape, corn, or callus. ? Your wound, scrape, corn, or callus feels warm to the touch. ? You have pus or a bad smell coming from the wound, scrape, corn, or callus. ? You have a fever. ? You have a red line going up your leg. Summary  Check your feet every day  for cuts, sores, red spots, swelling, and blisters.  Moisturize feet and legs daily.  Wear shoes that fit properly and have enough cushioning.  If you have foot problems, report any cuts, sores, or bruises to your health care provider immediately.  Schedule a complete foot exam at least once a year (annually) or more often if you have foot problems. This information is not intended to replace advice given to you by your health care provider. Make sure you discuss any questions you have with your health care provider. Document Revised: 12/22/2018 Document Reviewed: 05/02/2016 Elsevier Patient Education  2020 Elsevier Inc.  

## 2019-05-27 ENCOUNTER — Ambulatory Visit: Payer: Medicare Other | Attending: Internal Medicine

## 2019-05-27 DIAGNOSIS — Z23 Encounter for immunization: Secondary | ICD-10-CM

## 2019-05-27 NOTE — Progress Notes (Signed)
   Covid-19 Vaccination Clinic  Name:  Ryan Saunders    MRN: [id number] DOB: Jul 06, 1945  05/27/2019  [name] was observed post Covid-19 immunization for 15 minutes without incidence. He was provided with Vaccine Information Sheet and instruction to access the V-Safe system.   [name] was instructed to call 911 with any severe reactions post vaccine: Marland Kitchen Difficulty breathing  . Swelling of your face and throat  . A fast heartbeat  . A bad rash all over your body  . Dizziness and weakness    Immunizations Administered    Name Date Dose VIS Date Route   Pfizer COVID-19 Vaccine 05/27/2019  9:38 AM 0.3 mL 03/25/2019 Intramuscular   Manufacturer: [place]   Lot: [id number]   [place]: [id number]

## 2019-06-09 ENCOUNTER — Other Ambulatory Visit: Payer: Self-pay | Admitting: Urology

## 2019-06-09 DIAGNOSIS — C61 Malignant neoplasm of prostate: Secondary | ICD-10-CM

## 2019-06-19 ENCOUNTER — Ambulatory Visit: Payer: Medicare Other | Attending: Internal Medicine

## 2019-06-19 DIAGNOSIS — Z23 Encounter for immunization: Secondary | ICD-10-CM

## 2019-06-19 NOTE — Progress Notes (Signed)
   Covid-19 Vaccination Clinic  Name:  Ryan Saunders    MRN: [id number] DOB: 24-Jul-1945  06/19/2019  Mr. Kamath was observed post Covid-19 immunization for 15 minutes without incident. He was provided with Vaccine Information Sheet and instruction to access the V-Safe system.   Mr. Kina was instructed to call 911 with any severe reactions post vaccine: Marland Kitchen Difficulty breathing  . Swelling of face and throat  . A fast heartbeat  . A bad rash all over body  . Dizziness and weakness   Immunizations Administered    Name Date Dose VIS Date Route   Pfizer COVID-19 Vaccine 06/19/2019  8:07 AM 0.3 mL 03/25/2019 Intramuscular   Manufacturer: [place]   Lot: [id number]   [place]: [id number]

## 2019-07-12 ENCOUNTER — Other Ambulatory Visit: Payer: Self-pay

## 2019-07-12 ENCOUNTER — Encounter (HOSPITAL_COMMUNITY)
Admission: RE | Admit: 2019-07-12 | Discharge: 2019-07-12 | Disposition: A | Discharge status: Home / Self Care | Payer: Medicare Other | Source: Ambulatory Visit | Attending: Urology | Admitting: Urology

## 2019-07-12 DIAGNOSIS — C61 Malignant neoplasm of prostate: Secondary | ICD-10-CM | POA: Insufficient documentation

## 2019-07-12 IMAGING — NM NM BONE WHOLE BODY
2 series · 2 of 2 positions shown · non-contrast
Comparison: Whole-body bone scan [DATE]

CLINICAL DATA: Prostate cancer. Serum PSA 0.055 on [DATE]. Low
back pain.

EXAM:
NUCLEAR MEDICINE WHOLE BODY BONE SCAN
TECHNIQUE: Whole body anterior and posterior images were obtained approximately
3 hours after intravenous injection of radiopharmaceutical.
RADIOPHARMACEUTICALS:  21.0 mCi [XC] MDP IV

[Series 1: wbr_bone_40 whole body · 2.66mm/px · 1 of 1 slices shown (1 of 2)]
[im 1/1]
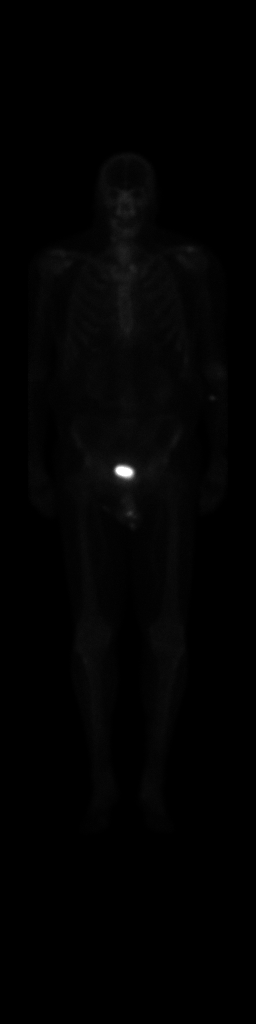

[Series 1: wbr_bone_40 whole body · 2.66mm/px · 1 of 1 slices shown (2 of 2)]
[im 1/1]
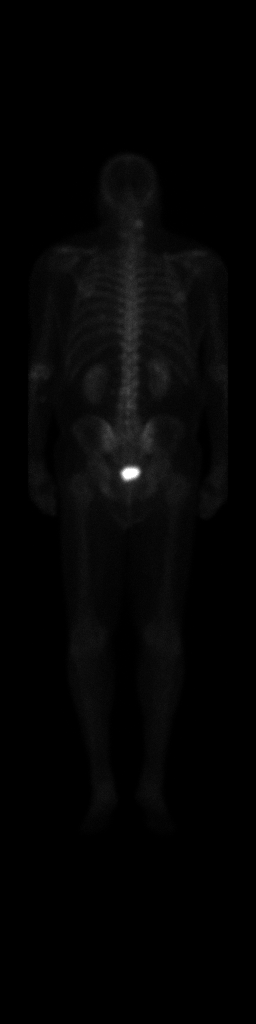

[2 of 2 positions shown; findings below may reference images not displayed]

FINDINGS: There is no osseous uptake suspicious for metastatic disease.
Degenerative activity in the upper right neck is best seen on the
posterior images and has mildly progressed. Additional degenerative
uptake in the left sternoclavicular joint and both acromioclavicular
joints appears stable. There is no suspicious activity in the lumbar
spine or pelvis. The soft tissue activity appears normal.
IMPRESSION: 1. No bone scan uptake suspicious for metastatic disease.
2. Scattered arthropathic activity as described, mildly increased in
the neck compared with previous exam.

## 2019-07-12 MED ORDER — TECHNETIUM TC 99M MEDRONATE IV KIT
20.0000 | PACK | Freq: Once | INTRAVENOUS | Status: AC | PRN
  Administered 2019-07-12: 21 via INTRAVENOUS

## 2019-07-19 ENCOUNTER — Ambulatory Visit: Payer: Medicare Other | Admitting: Podiatry

## 2019-07-19 ENCOUNTER — Other Ambulatory Visit: Payer: Self-pay

## 2019-07-19 ENCOUNTER — Encounter: Payer: Self-pay | Admitting: Podiatry

## 2019-07-19 VITALS — Temp 96.3°F

## 2019-07-19 DIAGNOSIS — M79675 Pain in left toe(s): Secondary | ICD-10-CM | POA: Diagnosis not present

## 2019-07-19 DIAGNOSIS — E119 Type 2 diabetes mellitus without complications: Secondary | ICD-10-CM | POA: Diagnosis not present

## 2019-07-19 DIAGNOSIS — M79674 Pain in right toe(s): Secondary | ICD-10-CM

## 2019-07-19 DIAGNOSIS — B351 Tinea unguium: Secondary | ICD-10-CM

## 2019-07-19 NOTE — Patient Instructions (Signed)
Diabetes Mellitus and Foot Care Foot care is an important part of your health, especially when you have diabetes. Diabetes may cause you to have problems because of poor blood flow (circulation) to your feet and legs, which can cause your skin to:  Become thinner and drier.  Break more easily.  Heal more slowly.  Peel and crack. You may also have nerve damage (neuropathy) in your legs and feet, causing decreased feeling in them. This means that you may not notice minor injuries to your feet that could lead to more serious problems. Noticing and addressing any potential problems early is the best way to prevent future foot problems. How to care for your feet Foot hygiene  Wash your feet daily with warm water and mild soap. Do not use hot water. Then, pat your feet and the areas between your toes until they are completely dry. Do not soak your feet as this can dry your skin.  Trim your toenails straight across. Do not dig under them or around the cuticle. File the edges of your nails with an emery board or nail file.  Apply a moisturizing lotion or petroleum jelly to the skin on your feet and to dry, brittle toenails. Use lotion that does not contain alcohol and is unscented. Do not apply lotion between your toes. Shoes and socks  Wear clean socks or stockings every day. Make sure they are not too tight. Do not wear knee-high stockings since they may decrease blood flow to your legs.  Wear shoes that fit properly and have enough cushioning. Always look in your shoes before you put them on to be sure there are no objects inside.  To break in new shoes, wear them for just a few hours a day. This prevents injuries on your feet. Wounds, scrapes, corns, and calluses  Check your feet daily for blisters, cuts, bruises, sores, and redness. If you cannot see the bottom of your feet, use a mirror or ask someone for help.  Do not cut corns or calluses or try to remove them with medicine.  If you  find a minor scrape, cut, or break in the skin on your feet, keep it and the skin around it clean and dry. You may clean these areas with mild soap and water. Do not clean the area with peroxide, alcohol, or iodine.  If you have a wound, scrape, corn, or callus on your foot, look at it several times a day to make sure it is healing and not infected. Check for: ? Redness, swelling, or pain. ? Fluid or blood. ? Warmth. ? Pus or a bad smell. General instructions  Do not cross your legs. This may decrease blood flow to your feet.  Do not use heating pads or hot water bottles on your feet. They may burn your skin. If you have lost feeling in your feet or legs, you may not know this is happening until it is too late.  Protect your feet from hot and cold by wearing shoes, such as at the beach or on hot pavement.  Schedule a complete foot exam at least once a year (annually) or more often if you have foot problems. If you have foot problems, report any cuts, sores, or bruises to your health care provider immediately. Contact a health care provider if:  You have a medical condition that increases your risk of infection and you have any cuts, sores, or bruises on your feet.  You have an injury that is not   healing.  You have redness on your legs or feet.  You feel burning or tingling in your legs or feet.  You have pain or cramps in your legs and feet.  Your legs or feet are numb.  Your feet always feel cold.  You have pain around a toenail. Get help right away if:  You have a wound, scrape, corn, or callus on your foot and: ? You have pain, swelling, or redness that gets worse. ? You have fluid or blood coming from the wound, scrape, corn, or callus. ? Your wound, scrape, corn, or callus feels warm to the touch. ? You have pus or a bad smell coming from the wound, scrape, corn, or callus. ? You have a fever. ? You have a red line going up your leg. Summary  Check your feet every day  for cuts, sores, red spots, swelling, and blisters.  Moisturize feet and legs daily.  Wear shoes that fit properly and have enough cushioning.  If you have foot problems, report any cuts, sores, or bruises to your health care provider immediately.  Schedule a complete foot exam at least once a year (annually) or more often if you have foot problems. This information is not intended to replace advice given to you by your health care provider. Make sure you discuss any questions you have with your health care provider. Document Revised: 12/22/2018 Document Reviewed: 05/02/2016 Elsevier Patient Education  2020 Elsevier Inc.  

## 2019-07-19 NOTE — Progress Notes (Signed)
Subjective: Ryan Saunders presents today for follow up of preventative diabetic foot care and painful mycotic nails b/l that are difficult to trim. Pain interferes with ambulation. Aggravating factors include wearing enclosed shoe gear. Pain is relieved with periodic professional debridement.   Ryan Saunders voices no new pedal concerns on today's visit. He has received both doses of the Pfizer COVID-19 vaccine and experienced no side effects.    He has been playing a few rounds of golf recently.   Allergies  Allergen Reactions  . Bee Venom     Other reaction(s): swelling in lips and mouth     Objective: Vitals:   07/19/19 0854  Temp: (!) 96.3 F (35.7 C)    Pt 74 y.o. year old Caucasian male WD, WN  in NAD. AAO x 3.   Vascular Examination:  Capillary refill time to digits immediate b/l. Palpable DP pulses b/l. Palpable PT pulses b/l. Pedal hair sparse b/l. Skin temperature gradient within normal limits b/l.  Dermatological Examination: No open wounds bilaterally. No interdigital macerations bilaterally. Toenails 2-5 bilaterally and R hallux elongated, dystrophic, thickened, and crumbly with subungual debris and tenderness to dorsal palpation. Pedal skin mildly atrophic b/l.  Musculoskeletal: Normal muscle strength 5/5 to all lower extremity muscle groups bilaterally, no pain crepitus or joint limitation noted with ROM b/l, bunion deformity noted b/l, Elongated 2nd digits b/l. and patient ambulates independent of any assistive aids  Neurological: Protective sensation intact 5/5 intact bilaterally with 10g monofilament b/l Vibratory sensation intact b/l Proprioception intact bilaterally  Assessment: 1. Pain due to onychomycosis of toenails of both feet   2. Controlled type 2 diabetes mellitus without complication, without long-term current use of insulin ([place])    Plan: -Continue diabetic foot care principles. Literature dispensed on today.  -Toenails 1-5 b/l were debrided in  length and girth with sterile nail nippers and dremel without iatrogenic bleeding.  -Patient to continue soft, supportive shoe gear daily. -Patient to report any pedal injuries to medical professional immediately. -Patient/POA to call should there be question/concern in the interim.  Return in about 3 months (around 10/18/2019).

## 2019-10-18 ENCOUNTER — Other Ambulatory Visit: Payer: Self-pay

## 2019-10-18 ENCOUNTER — Encounter: Payer: Self-pay | Admitting: Podiatry

## 2019-10-18 ENCOUNTER — Ambulatory Visit: Payer: Medicare Other | Admitting: Podiatry

## 2019-10-18 DIAGNOSIS — E119 Type 2 diabetes mellitus without complications: Secondary | ICD-10-CM

## 2019-10-18 DIAGNOSIS — M79675 Pain in left toe(s): Secondary | ICD-10-CM

## 2019-10-18 DIAGNOSIS — M79674 Pain in right toe(s): Secondary | ICD-10-CM | POA: Diagnosis not present

## 2019-10-18 DIAGNOSIS — M2011 Hallux valgus (acquired), right foot: Secondary | ICD-10-CM | POA: Diagnosis not present

## 2019-10-18 DIAGNOSIS — M2012 Hallux valgus (acquired), left foot: Secondary | ICD-10-CM | POA: Diagnosis not present

## 2019-10-18 DIAGNOSIS — B351 Tinea unguium: Secondary | ICD-10-CM

## 2019-10-18 NOTE — Patient Instructions (Signed)
Diabetes Mellitus and Foot Care Foot care is an important part of your health, especially when you have diabetes. Diabetes may cause you to have problems because of poor blood flow (circulation) to your feet and legs, which can cause your skin to:  Become thinner and drier.  Break more easily.  Heal more slowly.  Peel and crack. You may also have nerve damage (neuropathy) in your legs and feet, causing decreased feeling in them. This means that you may not notice minor injuries to your feet that could lead to more serious problems. Noticing and addressing any potential problems early is the best way to prevent future foot problems. How to care for your feet Foot hygiene  Wash your feet daily with warm water and mild soap. Do not use hot water. Then, pat your feet and the areas between your toes until they are completely dry. Do not soak your feet as this can dry your skin.  Trim your toenails straight across. Do not dig under them or around the cuticle. File the edges of your nails with an emery board or nail file.  Apply a moisturizing lotion or petroleum jelly to the skin on your feet and to dry, brittle toenails. Use lotion that does not contain alcohol and is unscented. Do not apply lotion between your toes. Shoes and socks  Wear clean socks or stockings every day. Make sure they are not too tight. Do not wear knee-high stockings since they may decrease blood flow to your legs.  Wear shoes that fit properly and have enough cushioning. Always look in your shoes before you put them on to be sure there are no objects inside.  To break in new shoes, wear them for just a few hours a day. This prevents injuries on your feet. Wounds, scrapes, corns, and calluses  Check your feet daily for blisters, cuts, bruises, sores, and redness. If you cannot see the bottom of your feet, use a mirror or ask someone for help.  Do not cut corns or calluses or try to remove them with medicine.  If you  find a minor scrape, cut, or break in the skin on your feet, keep it and the skin around it clean and dry. You may clean these areas with mild soap and water. Do not clean the area with peroxide, alcohol, or iodine.  If you have a wound, scrape, corn, or callus on your foot, look at it several times a day to make sure it is healing and not infected. Check for: ? Redness, swelling, or pain. ? Fluid or blood. ? Warmth. ? Pus or a bad smell. General instructions  Do not cross your legs. This may decrease blood flow to your feet.  Do not use heating pads or hot water bottles on your feet. They may burn your skin. If you have lost feeling in your feet or legs, you may not know this is happening until it is too late.  Protect your feet from hot and cold by wearing shoes, such as at the beach or on hot pavement.  Schedule a complete foot exam at least once a year (annually) or more often if you have foot problems. If you have foot problems, report any cuts, sores, or bruises to your health care provider immediately. Contact a health care provider if:  You have a medical condition that increases your risk of infection and you have any cuts, sores, or bruises on your feet.  You have an injury that is not   healing.  You have redness on your legs or feet.  You feel burning or tingling in your legs or feet.  You have pain or cramps in your legs and feet.  Your legs or feet are numb.  Your feet always feel cold.  You have pain around a toenail. Get help right away if:  You have a wound, scrape, corn, or callus on your foot and: ? You have pain, swelling, or redness that gets worse. ? You have fluid or blood coming from the wound, scrape, corn, or callus. ? Your wound, scrape, corn, or callus feels warm to the touch. ? You have pus or a bad smell coming from the wound, scrape, corn, or callus. ? You have a fever. ? You have a red line going up your leg. Summary  Check your feet every day  for cuts, sores, red spots, swelling, and blisters.  Moisturize feet and legs daily.  Wear shoes that fit properly and have enough cushioning.  If you have foot problems, report any cuts, sores, or bruises to your health care provider immediately.  Schedule a complete foot exam at least once a year (annually) or more often if you have foot problems. This information is not intended to replace advice given to you by your health care provider. Make sure you discuss any questions you have with your health care provider. Document Revised: 12/22/2018 Document Reviewed: 05/02/2016 Elsevier Patient Education  2020 Elsevier Inc.  

## 2019-10-20 NOTE — Progress Notes (Signed)
Subjective: Ryan Saunders presents today for for annual diabetic foot examination and painful thick toenails that are difficult to trim. Pain interferes with ambulation. Aggravating factors include wearing enclosed shoe gear. Pain is relieved with periodic professional debridement.   He voices no new pedal concerns on today's visit. He continues to enjoy playing golf in his free time.  Tisovec, Fransico Him, MD is patient's PCP. Last visit   Past Medical History:  Diagnosis Date   Cancer [facility])    PROSTATE CANCER   Coronary artery disease    Crohn's disease ([place])    Hyperlipidemia    Hypertension    PVC (premature ventricular contraction)    PVD (peripheral vascular disease) ([place])     Patient Active Problem List   Diagnosis Date Noted   Type 2 diabetes mellitus without complication ([place]) 48/27/0786   Malignant neoplasm of prostate ([place]) 02/08/2015   CAD (coronary artery disease) 04/21/2014   HTN (hypertension) 04/21/2014   Hyperlipidemia 04/21/2014   Angiopathy, peripheral ([place]) 03/14/2014   Benign neoplasm of colon 09/21/2013   Microalbuminuria 03/01/2013   Diabetes mellitus type 2, uncontrolled ([place]) 08/16/2010   Health examination of defined subpopulation 02/08/2009   Benign essential HTN 12/28/2008   CD (Crohn's disease) ([place]) 12/28/2008   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     B Complex Vitamins (B COMPLEX PO) Take 1 tablet by mouth daily.      BD PEN NEEDLE NANO U/F 32G X 4 MM MISC USE TO INJECT INSULIN TWICE DAILY     CALCIUM PO Take 1 tablet by mouth daily.     Leuprolide Acetate, 6 Month, (LUPRON DEPOT,  64-MONTH, IM) Inject 1 Dose into the muscle every 6 (six) months.     LEVEMIR FLEXTOUCH 100 UNIT/ML Pen Inject 50 Units into the skin daily.      lisinopril (ZESTRIL) 20 MG tablet Take 20 mg by mouth daily.     Multiple Vitamin (MULTIVITAMIN) tablet Take 1 tablet by mouth daily. AREDS     ONETOUCH VERIO test strip 2 (two) times daily. test blood sugar     VICTOZA 18 MG/3ML SOPN Inject 18 mg into the skin daily.   6   ZETIA 10 MG tablet Take 10 mg by mouth daily.  3   No current facility-administered medications on file prior to visit.     Allergies  Allergen Reactions   Bee Venom     Other reaction(s): swelling in lips and mouth    Social History   Occupational History   Occupation: retired  Tobacco Use   Smoking status: Former Smoker    Packs/day: 2.00    Years: 50.00    Pack years: 100.00    Types: Cigarettes    Quit date: 02/08/2004    Years since quitting: 15.7   Smokeless tobacco: Never Used  Substance and Sexual Activity   Alcohol use: Yes    Alcohol/week: 0.0 standard drinks    Comment: SOCAILLY   Drug use: No   Sexual activity: Not on file    Family History  Problem Relation Age of Onset   CVA  Mother    Heart attack Mother    Crohn's disease Mother    Diabetes Father    Heart failure Paternal Grandmother     Immunization History  Administered Date(s) Administered   PFIZER SARS-COV-2 Vaccination 05/27/2019, 06/19/2019     Objective: There were no vitals filed for this visit.  Ryan Diones Wente is a pleasant 74 y.o. Caucasian, male, WD, WN in NAD. AAO X 3.  Vascular Examination: Neurovascular status unchanged b/l lower extremities. Capillary refill time to digits immediate b/l. Palpable pedal pulses b/l LE. Pedal hair sparse. Lower extremity skin temperature gradient within normal limits. No pain with calf compression b/l. No edema noted b/l lower extremities.  Dermatological Examination: Mild skin atrophy noted b/l feet.  No open  wounds bilaterally. No interdigital macerations bilaterally. Toenails 2-5 bilaterally and R hallux elongated, discolored, dystrophic, thickened, and crumbly with subungual debris and tenderness to dorsal palpation. Anonychia noted L hallux. Nailbed(s) epithelialized.   Musculoskeletal Examination: Normal muscle strength 5/5 to all lower extremity muscle groups bilaterally. Hallux valgus with bunion deformity noted b/l lower extremities. Limited joint ROM to the 1st MPJ of the right foot. Patient ambulates independent of any assistive aids.  Footwear Assessment: Does the patient wear appropriate shoes? Yes. Does the patient need inserts/orthotics? No.  Neurological Examination: Protective sensation intact 5/5 intact bilaterally with 10g monofilament b/l. Vibratory sensation intact b/l. Proprioception intact bilaterally. Babinski reflex negative b/l. Clonus negative b/l.  Assessment: No diagnosis found.   Risk Categorization: Low Risk :  Patient has all of the following: Intact protective sensation No prior foot ulcer  No severe deformity Pedal pulses present  Plan: -Examined patient. -Diabetic foot examination performed on today's visit. -Continue diabetic foot care principles. -Toenails 2-5 bilaterally and R hallux debrided in length and girth without iatrogenic bleeding with sterile nail nipper and dremel.  -Patient to report any pedal injuries to medical professional immediately. -Patient to continue soft, supportive shoe gear daily. -Patient/POA to call should there be question/concern in the interim.  Return in about 3 months (around 01/18/2020) for diabetic nail trim.  Marzetta Board, DPM

## 2020-01-24 ENCOUNTER — Encounter: Payer: Self-pay | Admitting: Podiatry

## 2020-01-24 ENCOUNTER — Ambulatory Visit: Payer: Medicare Other | Admitting: Podiatry

## 2020-01-24 ENCOUNTER — Other Ambulatory Visit: Payer: Self-pay

## 2020-01-24 DIAGNOSIS — Z794 Long term (current) use of insulin: Secondary | ICD-10-CM

## 2020-01-24 DIAGNOSIS — M79675 Pain in left toe(s): Secondary | ICD-10-CM

## 2020-01-24 DIAGNOSIS — E119 Type 2 diabetes mellitus without complications: Secondary | ICD-10-CM | POA: Diagnosis not present

## 2020-01-24 DIAGNOSIS — M79674 Pain in right toe(s): Secondary | ICD-10-CM

## 2020-01-24 DIAGNOSIS — M205X1 Other deformities of toe(s) (acquired), right foot: Secondary | ICD-10-CM

## 2020-01-24 DIAGNOSIS — M2011 Hallux valgus (acquired), right foot: Secondary | ICD-10-CM

## 2020-01-24 DIAGNOSIS — B351 Tinea unguium: Secondary | ICD-10-CM

## 2020-01-24 DIAGNOSIS — M2012 Hallux valgus (acquired), left foot: Secondary | ICD-10-CM

## 2020-01-24 NOTE — Progress Notes (Signed)
Subjective:  Patient ID: Ryan Saunders, male    DOB: 30-Apr-1945,  MRN: [id number]  74 y.o. male presents with preventative diabetic foot care and painful thick toenails that are difficult to trim. Pain interferes with ambulation. Aggravating factors include wearing enclosed shoe gear. Pain is relieved with periodic professional debridement..    Patient's blood sugar was 109 mg/dl this morning.  Patient's A1c: patient states last A1c was 6.8%.   PCP: Haywood Pao, MD and patient states last visit was: 6 months ago  Review of Systems: Negative except as noted in the HPI.   He voices no new pedal problems on today's visit. Past Medical History:  Diagnosis Date  . Cancer [facility])    PROSTATE CANCER  . Coronary artery disease   . Crohn's disease ([place])   . Hyperlipidemia   . Hypertension   . PVC (premature ventricular contraction)   . PVD (peripheral vascular disease) ([place])    Past Surgical History:  Procedure Laterality Date  . ANAL FISTULA REPAIR    . CRYOTHERAPY  2003   every 7 years  . INSERTION PROSTATE RADIATION SEED  2003  . VASECTOMY  1972   Patient Active Problem List   Diagnosis Date Noted  . Type 2 diabetes mellitus without complication ([place]) 35/00/9381  . Malignant neoplasm of prostate ([place]) 02/08/2015  . CAD (coronary artery disease) 04/21/2014  . HTN (hypertension) 04/21/2014  . Hyperlipidemia 04/21/2014  . Angiopathy, peripheral ([place]) 03/14/2014  . Benign neoplasm of colon 09/21/2013  . Microalbuminuria 03/01/2013  . Diabetes mellitus type 2, uncontrolled ([place]) 08/16/2010  . Health examination of defined subpopulation 02/08/2009  . Benign essential HTN 12/28/2008  . CD (Crohn's disease) ([place]) 12/28/2008  . 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  atorvastatin (LIPITOR) 80 MG tablet, Take 80 mg by mouth daily., Disp: , Rfl:  .  B Complex Vitamins (B COMPLEX PO), Take 1 tablet by mouth daily. , Disp: , Rfl:  .  BD PEN  NEEDLE NANO U/F 32G X 4 MM MISC, USE TO INJECT INSULIN TWICE DAILY, Disp: , Rfl:  .  CALCIUM PO, Take 1 tablet by mouth daily., Disp: , Rfl:  .  Leuprolide Acetate, 6 Month, (LUPRON DEPOT, 40-MONTH, IM), Inject 1 Dose into the muscle every 6 (six) months., Disp: , Rfl:  .  LEVEMIR FLEXTOUCH 100 UNIT/ML Pen, Inject 50 Units into the skin daily. , Disp: , Rfl:  .  lisinopril (ZESTRIL) 20 MG tablet, Take 20 mg by mouth daily., Disp: , Rfl:  .  Multiple Vitamin (MULTIVITAMIN) tablet, Take 1 tablet by mouth daily. AREDS, Disp: , Rfl:  .  ONETOUCH VERIO test strip, 2 (two) times daily. test blood sugar, Disp: , Rfl:  .  VICTOZA 18 MG/3ML SOPN, Inject 18 mg into the skin daily. , Disp: , Rfl: 6 .  ZETIA 10 MG tablet, Take 10 mg by mouth daily., Disp: , Rfl: 3 Allergies  Allergen Reactions  . Bee Venom     Other reaction(s): swelling in lips and mouth   Social History   Tobacco Use  Smoking Status Former Smoker  . Packs/day: 2.00  . Years: 50.00  . Pack years: 100.00  . Types: Cigarettes  . Quit date: 02/08/2004  . Years since quitting: 15.9  Smokeless Tobacco Never Used   Objective:  There were no vitals filed for this visit. Constitutional Patient is a pleasant 74 y.o. Caucasian male in NAD.Marland Kitchen AAO x 3.  Vascular Capillary  refill time to digits immediate b/l. Palpable pedal pulses b/l LE. Pedal hair sparse. Lower extremity skin temperature gradient within normal limits. No pain with calf compression b/l. No edema noted b/l lower extremities. No cyanosis or clubbing noted.  Neurologic Normal speech. Protective sensation intact 5/5 intact bilaterally with 10g monofilament b/l. Vibratory sensation intact b/l. Proprioception intact bilaterally.  Dermatologic Pedal skin with normal turgor, texture and tone bilaterally. No open wounds bilaterally. No interdigital macerations bilaterally. Toenails 2-5 bilaterally and R hallux elongated, discolored, dystrophic, thickened, and crumbly with subungual  debris and tenderness to dorsal palpation. Anonychia noted L hallux. Nailbed(s) epithelialized.   Orthopedic: Normal muscle strength 5/5 to all lower extremity muscle groups bilaterally. Limited joint ROM to the 1st MPJ of the right foot. Patient ambulates independent of any assistive aids.   Assessment:   1. Pain due to onychomycosis of toenails of both feet   2. Hallux valgus, acquired, bilateral   3. Hallux limitus of right foot   4. Type 2 diabetes mellitus without complication, with long-term current use of insulin ([place])    Plan:  Patient was evaluated and treated and all questions answered.  Onychomycosis with pain -Nails palliatively debridement as below. -Educated on self-care  Procedure: Nail Debridement Rationale: Pain Type of Debridement: manual, sharp debridement. Instrumentation: Nail nipper, rotary burr. Number of Nails: 9  -Examined patient. -No new findings. No new orders. -Continue diabetic foot care principles. -Toenails 2-5 bilaterally and R hallux debrided in length and girth without iatrogenic bleeding with sterile nail nipper and dremel.  -Patient to report any pedal injuries to medical professional immediately. -Patient to continue soft, supportive shoe gear daily. -Patient/POA to call should there be question/concern in the interim.  Return in about 3 months (around 04/25/2020) for diabetic foot care, 3 month toenail debridement.  Marzetta Board, DPM

## 2020-03-04 ENCOUNTER — Encounter: Payer: Self-pay | Admitting: Cardiovascular Disease

## 2020-03-04 NOTE — Progress Notes (Signed)
Ryan Saunders Date of Birth  June 05, 1945       [place] [address], [place], [place]  [address]   [place], [place]  [address] [place]   Fax  [phone]     Fax [phone]  Problem List: 1. CAD - mild by cath in 2007 2. Hypertension 3, hyperlipidemia 4. Diabetes Mellitus 5. Prostate cancer    Ryan Saunders is a 74 yo man who I have seen in the past.  He is not having any specific issues. He has not had any CP or dyspnea.  Exercises on occasion.    Plays golf,  Retired from [employer].  Oct. 6. 2016:  No CP , no dyspnea.  Has been having some palpitations. Has had some episodes of nausea and feeling poorly after exercising or being out in the heat.  His symptoms resolved over the next 30 minutes.  Has not tried to aggressively hydrate.   August 06, 2015:  Ryan Saunders is doing ok. Still has palpitations. Has been drinking V8  on occasion. He also uses Nun tablets in his water.  Seems to be helping .  He has no symptoms ,   Can only tell that his heart is skipping when he takes his pulse.   August 07, 2016:  Seen for follow up of palpitations, weakness and volume depletion.  Doing great .    PSA has been increasing slowly .   September 25, 2017  Ryan Saunders is seen today . No CP or dyspnea . Is on Lupron shots now ,  Has had cryoablation,  Has had radiation implants  Has had 2 separate prostate cancers .   Not a candidate for XRT  BP at home is well controlled.   Avoids salt,    Has lost some weight . Gets regular exercise  Is followed by Dr. Osborne Casco.   Nov. 22, 2021: Ryan Saunders is seen today for follow up of his palpitations. Exercising well .  Lipids are managed by dr. Osborne Casco.  No complaints     Current Outpatient Medications  Medication Sig Dispense Refill  . atorvastatin (LIPITOR) 80 MG tablet Take 80 mg by mouth daily.    . B Complex Vitamins (B  COMPLEX PO) Take 1 tablet by mouth daily.     . BD PEN NEEDLE NANO U/F 32G X 4 MM MISC USE TO INJECT INSULIN TWICE DAILY    . CALCIUM PO Take 1 tablet by mouth daily.    Marland Kitchen Leuprolide Acetate, 6 Month, (LUPRON DEPOT, 43-MONTH, IM) Inject 1 Dose into the muscle every 6 (six) months.    Ryan Saunders FLEXTOUCH 100 UNIT/ML Pen Inject 50 Units into the skin daily.     Marland Kitchen lisinopril (ZESTRIL) 20 MG tablet Take 20 mg by mouth daily.    . Multiple Vitamin (MULTIVITAMIN) tablet Take 1 tablet by mouth daily. AREDS    . ONETOUCH VERIO test strip 2 (two) times daily. test blood sugar    . VICTOZA 18 MG/3ML SOPN Inject 18 mg into the skin daily.   6  . ZETIA 10 MG tablet Take 10 mg by mouth daily.  3   No current facility-administered medications for this visit.     Allergies  Allergen Reactions  . Bee Venom     Other reaction(s): swelling in lips and mouth    Past Medical History:  Diagnosis Date  .  Cancer [facility])    PROSTATE CANCER  . Coronary artery disease   . Crohn's disease ([place])   . Hyperlipidemia   . Hypertension   . PVC (premature ventricular contraction)   . PVD (peripheral vascular disease) ([place])     Past Surgical History:  Procedure Laterality Date  . ANAL FISTULA REPAIR    . CRYOTHERAPY  2003   every 7 years  . INSERTION PROSTATE RADIATION SEED  2003  . VASECTOMY  1972    Social History   Tobacco Use  Smoking Status Former Smoker  . Packs/day: 2.00  . Years: 50.00  . Pack years: 100.00  . Types: Cigarettes  . Quit date: 02/08/2004  . Years since quitting: 16.0  Smokeless Tobacco Never Used    Social History   Substance and Sexual Activity  Alcohol Use Yes  . Alcohol/week: 0.0 standard drinks   Comment: SOCAILLY    Family History  Problem Relation Age of Onset  . CVA Mother   . Heart attack Mother   . Crohn's disease Mother   . Diabetes Father   . Heart failure Paternal Grandmother     Reviw of Systems:  Reviewed in the HPI.  All other systems are  negative.  Physical Exam: Blood pressure 132/76, pulse (!) 58, height 5\' 11"  (1.803 m), weight 192 lb (87.1 kg), SpO2 98 %.  GEN:  Well nourished, well developed in no acute distress HEENT: Normal NECK: No JVD; No carotid bruits LYMPHATICS: No lymphadenopathy CARDIAC: RRR , no murmurs, rubs, gallops RESPIRATORY:  Clear to auscultation without rales, wheezing or rhonchi  ABDOMEN: Soft, non-tender, non-distended MUSCULOSKELETAL:  No edema; No deformity  SKIN: Warm and dry NEUROLOGIC:  Alert and oriented x 3   ECG:  Nov. 22, 2021:    Sinus brady at 58.  RBBB  , no changes from previous   Assessment / Plan:   1. CAD - no angina,  Is active .  Cont meds   2. Hypertension -  BP is well controlled.   Cont meds.    3, hyperlipidemia -   Managed by Dr. Osborne Casco.   4. Diabetes Mellitus -  5. Prostate cancer -he is now on Lupron shots.   Will see him in 1 year.      Mertie Moores, MD  03/05/2020 11:34 AM    [address],  [place], [place]  [address] Pager [phone] Phone: [phone]; Fax: [phone]

## 2020-03-05 ENCOUNTER — Other Ambulatory Visit: Payer: Self-pay

## 2020-03-05 ENCOUNTER — Encounter: Payer: Self-pay | Admitting: Cardiovascular Disease

## 2020-03-05 ENCOUNTER — Ambulatory Visit: Payer: Medicare Other | Admitting: Cardiovascular Disease

## 2020-03-05 VITALS — BP 132/76 | HR 58 | Ht 71.0 in | Wt 192.0 lb

## 2020-03-05 DIAGNOSIS — I10 Essential (primary) hypertension: Secondary | ICD-10-CM | POA: Diagnosis not present

## 2020-03-05 DIAGNOSIS — E785 Hyperlipidemia, unspecified: Secondary | ICD-10-CM | POA: Diagnosis not present

## 2020-03-05 DIAGNOSIS — I2510 Atherosclerotic heart disease of native coronary artery without angina pectoris: Secondary | ICD-10-CM

## 2020-03-05 DIAGNOSIS — Z794 Long term (current) use of insulin: Secondary | ICD-10-CM

## 2020-03-05 DIAGNOSIS — E119 Type 2 diabetes mellitus without complications: Secondary | ICD-10-CM | POA: Diagnosis not present

## 2020-03-05 NOTE — Patient Instructions (Signed)

## 2020-05-01 ENCOUNTER — Ambulatory Visit: Payer: Medicare Other | Admitting: Podiatry

## 2020-05-02 DIAGNOSIS — Z125 Encounter for screening for malignant neoplasm of prostate: Secondary | ICD-10-CM | POA: Diagnosis not present

## 2020-05-02 DIAGNOSIS — E1129 Type 2 diabetes mellitus with other diabetic kidney complication: Secondary | ICD-10-CM | POA: Diagnosis not present

## 2020-05-02 DIAGNOSIS — E7800 Pure hypercholesterolemia, unspecified: Secondary | ICD-10-CM | POA: Diagnosis not present

## 2020-05-09 DIAGNOSIS — Z794 Long term (current) use of insulin: Secondary | ICD-10-CM | POA: Diagnosis not present

## 2020-05-09 DIAGNOSIS — E7800 Pure hypercholesterolemia, unspecified: Secondary | ICD-10-CM | POA: Diagnosis not present

## 2020-05-09 DIAGNOSIS — I1310 Hypertensive heart and chronic kidney disease without heart failure, with stage 1 through stage 4 chronic kidney disease, or unspecified chronic kidney disease: Secondary | ICD-10-CM | POA: Diagnosis not present

## 2020-05-09 DIAGNOSIS — R809 Proteinuria, unspecified: Secondary | ICD-10-CM | POA: Diagnosis not present

## 2020-05-09 DIAGNOSIS — Z0000 Encounter for general adult medical examination without abnormal findings: Secondary | ICD-10-CM | POA: Diagnosis not present

## 2020-05-09 DIAGNOSIS — C61 Malignant neoplasm of prostate: Secondary | ICD-10-CM | POA: Diagnosis not present

## 2020-05-09 DIAGNOSIS — E1129 Type 2 diabetes mellitus with other diabetic kidney complication: Secondary | ICD-10-CM | POA: Diagnosis not present

## 2020-05-09 DIAGNOSIS — I2510 Atherosclerotic heart disease of native coronary artery without angina pectoris: Secondary | ICD-10-CM | POA: Diagnosis not present

## 2020-05-09 DIAGNOSIS — N182 Chronic kidney disease, stage 2 (mild): Secondary | ICD-10-CM | POA: Diagnosis not present

## 2020-05-09 DIAGNOSIS — E1151 Type 2 diabetes mellitus with diabetic peripheral angiopathy without gangrene: Secondary | ICD-10-CM | POA: Diagnosis not present

## 2020-05-23 ENCOUNTER — Ambulatory Visit: Payer: Medicare HMO | Admitting: Podiatry

## 2020-05-23 ENCOUNTER — Other Ambulatory Visit: Payer: Self-pay

## 2020-05-23 ENCOUNTER — Encounter: Payer: Self-pay | Admitting: Podiatry

## 2020-05-23 DIAGNOSIS — M2011 Hallux valgus (acquired), right foot: Secondary | ICD-10-CM

## 2020-05-23 DIAGNOSIS — Z794 Long term (current) use of insulin: Secondary | ICD-10-CM | POA: Diagnosis not present

## 2020-05-23 DIAGNOSIS — B351 Tinea unguium: Secondary | ICD-10-CM | POA: Diagnosis not present

## 2020-05-23 DIAGNOSIS — E119 Type 2 diabetes mellitus without complications: Secondary | ICD-10-CM

## 2020-05-23 DIAGNOSIS — M79674 Pain in right toe(s): Secondary | ICD-10-CM

## 2020-05-23 DIAGNOSIS — M205X1 Other deformities of toe(s) (acquired), right foot: Secondary | ICD-10-CM

## 2020-05-23 DIAGNOSIS — M2012 Hallux valgus (acquired), left foot: Secondary | ICD-10-CM

## 2020-05-23 DIAGNOSIS — M79675 Pain in left toe(s): Secondary | ICD-10-CM

## 2020-05-23 NOTE — Progress Notes (Signed)
Subjective:  Patient ID: Ryan Saunders, male    DOB: June 15, 1945,  MRN: [id number]  75 y.o. male presents with preventative diabetic foot care and painful thick toenails that are difficult to trim. Pain interferes with ambulation. Aggravating factors include wearing enclosed shoe gear. Pain is relieved with periodic professional debridement..    Patient's blood sugar was 95 mg/dl this morning.  He voices no new pedal concerns on today's visit.  PCP: Haywood Pao, MD and patient states last visit was: one month ago.  Review of Systems: Negative except as noted in the HPI.   He voices no new pedal problems on today's visit. Past Medical History:  Diagnosis Date  . Cancer [facility])    PROSTATE CANCER  . Coronary artery disease   . Crohn's disease ([place])   . Hyperlipidemia   . Hypertension   . PVC (premature ventricular contraction)   . PVD (peripheral vascular disease) ([place])    Past Surgical History:  Procedure Laterality Date  . ANAL FISTULA REPAIR    . CRYOTHERAPY  2003   every 7 years  . INSERTION PROSTATE RADIATION SEED  2003  . VASECTOMY  1972   Patient Active Problem List   Diagnosis Date Noted  . Type 2 diabetes mellitus without complication ([place]) 25/36/6440  . Malignant neoplasm of prostate ([place]) 02/08/2015  . CAD (coronary artery disease) 04/21/2014  . HTN (hypertension) 04/21/2014  . Hyperlipidemia 04/21/2014  . Angiopathy, peripheral ([place]) 03/14/2014  . Benign neoplasm of colon 09/21/2013  . Microalbuminuria 03/01/2013  . Diabetes mellitus type 2, uncontrolled ([place]) 08/16/2010  . Health examination of defined subpopulation 02/08/2009  . Benign essential HTN 12/28/2008  . CD (Crohn's disease) ([place]) 12/28/2008  . 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  atorvastatin (LIPITOR) 80 MG tablet, Take 80 mg by mouth daily., Disp: , Rfl:  .  B Complex Vitamins (B COMPLEX PO), Take 1 tablet by mouth daily. , Disp: , Rfl:  .  BD PEN  NEEDLE NANO U/F 32G X 4 MM MISC, USE TO INJECT INSULIN TWICE DAILY, Disp: , Rfl:  .  CALCIUM PO, Take 1 tablet by mouth daily., Disp: , Rfl:  .  Leuprolide Acetate, 6 Month, (LUPRON DEPOT, 36-MONTH, IM), Inject 1 Dose into the muscle every 6 (six) months., Disp: , Rfl:  .  LEVEMIR FLEXTOUCH 100 UNIT/ML Pen, Inject 50 Units into the skin daily. , Disp: , Rfl:  .  lisinopril (ZESTRIL) 20 MG tablet, Take 20 mg by mouth daily., Disp: , Rfl:  .  Multiple Vitamin (MULTIVITAMIN) tablet, Take 1 tablet by mouth daily. AREDS, Disp: , Rfl:  .  ONETOUCH VERIO test strip, 2 (two) times daily. test blood sugar, Disp: , Rfl:  .  VICTOZA 18 MG/3ML SOPN, Inject 18 mg into the skin daily. , Disp: , Rfl: 6 .  ZETIA 10 MG tablet, Take 10 mg by mouth daily., Disp: , Rfl: 3 Allergies  Allergen Reactions  . Bee Venom     Other reaction(s): swelling in lips and mouth   Social History   Tobacco Use  Smoking Status Former Smoker  . Packs/day: 2.00  . Years: 50.00  . Pack years: 100.00  . Types: Cigarettes  . Quit date: 02/08/2004  . Years since quitting: 16.2  Smokeless Tobacco Never Used   Objective:  There were no vitals filed for this visit. Constitutional Patient is a pleasant 75 y.o. Caucasian male in NAD.Marland Kitchen AAO x 3.  Vascular Capillary  refill time to digits immediate b/l. Palpable pedal pulses b/l LE. Pedal hair sparse. Lower extremity skin temperature gradient within normal limits. No pain with calf compression b/l. No edema noted b/l lower extremities. No cyanosis or clubbing noted.  Neurologic Normal speech. Protective sensation intact 5/5 intact bilaterally with 10g monofilament b/l. Vibratory sensation intact b/l. Proprioception intact bilaterally.  Dermatologic Pedal skin with normal turgor, texture and tone bilaterally. No open wounds bilaterally. No interdigital macerations bilaterally. Toenails 2-5 bilaterally and R hallux elongated, discolored, dystrophic, thickened, and crumbly with subungual  debris and tenderness to dorsal palpation. Anonychia noted L hallux. Nailbed(s) epithelialized.   Orthopedic: Normal muscle strength 5/5 to all lower extremity muscle groups bilaterally. Limited joint ROM to the 1st MPJ of the right foot. Patient ambulates independent of any assistive aids.   Assessment:   1. Pain due to onychomycosis of toenails of both feet   2. Hallux valgus, acquired, bilateral   3. Hallux limitus of right foot   4. Type 2 diabetes mellitus without complication, with long-term current use of insulin ([place])    Plan:  Patient was evaluated and treated and all questions answered.  Onychomycosis with pain -Nails palliatively debridement as below. -Educated on self-care  Procedure: Nail Debridement Rationale: Pain Type of Debridement: manual, sharp debridement. Instrumentation: Nail nipper, rotary burr. Number of Nails: 9  -Examined patient. -No new findings. No new orders. -Continue diabetic foot care principles. -Toenails 2-5 bilaterally and R hallux debrided in length and girth without iatrogenic bleeding with sterile nail nipper and dremel.  -Patient to report any pedal injuries to medical professional immediately. -Patient to continue soft, supportive shoe gear daily. -Patient/POA to call should there be question/concern in the interim.  Return in about 3 months (around 08/20/2020).  Marzetta Board, DPM

## 2020-07-23 DIAGNOSIS — M8580 Other specified disorders of bone density and structure, unspecified site: Secondary | ICD-10-CM | POA: Diagnosis not present

## 2020-07-23 DIAGNOSIS — C61 Malignant neoplasm of prostate: Secondary | ICD-10-CM | POA: Diagnosis not present

## 2020-07-23 DIAGNOSIS — N130 Hydronephrosis with ureteropelvic junction obstruction: Secondary | ICD-10-CM | POA: Diagnosis not present

## 2020-08-31 ENCOUNTER — Ambulatory Visit: Payer: Medicare HMO | Admitting: Podiatry

## 2020-08-31 ENCOUNTER — Encounter: Payer: Self-pay | Admitting: Podiatry

## 2020-08-31 ENCOUNTER — Other Ambulatory Visit: Payer: Self-pay

## 2020-08-31 DIAGNOSIS — B351 Tinea unguium: Secondary | ICD-10-CM | POA: Diagnosis not present

## 2020-08-31 DIAGNOSIS — M79674 Pain in right toe(s): Secondary | ICD-10-CM | POA: Diagnosis not present

## 2020-08-31 DIAGNOSIS — M205X1 Other deformities of toe(s) (acquired), right foot: Secondary | ICD-10-CM

## 2020-08-31 DIAGNOSIS — Z794 Long term (current) use of insulin: Secondary | ICD-10-CM

## 2020-08-31 DIAGNOSIS — M79675 Pain in left toe(s): Secondary | ICD-10-CM

## 2020-08-31 DIAGNOSIS — E119 Type 2 diabetes mellitus without complications: Secondary | ICD-10-CM

## 2020-08-31 NOTE — Progress Notes (Signed)
  Subjective:  Patient ID: Ryan Saunders, male    DOB: 16-Oct-1945,  MRN: [id number]  Ryan Saunders presents to clinic today for preventative diabetic foot care and painful thick toenails that are difficult to trim. Pain interferes with ambulation. Aggravating factors include wearing enclosed shoe gear. Pain is relieved with periodic professional debridement.   He states his left 4th digit toenail peeled off. Denies any trauma preceding incident. Denies any redness, drainage or swelling of digit.  States he will be going golfing with his wife and friends tonight.  PCP is Dr. Domenick Gong and last visit was 05/09/2020.  Allergies  Allergen Reactions  . Bee Venom     Other reaction(s): swelling in lips and mouth  . Other Other (See Comments)    Review of Systems: Negative except as noted in the HPI. Objective:   Constitutional Ryan Saunders is a pleasant 75 y.o. Caucasian male, WD, WN in NAD. AAO x 3.   Vascular Neurovascular status unchanged b/l lower extremities. Capillary refill time to digits immediate b/l. Palpable pedal pulses b/l LE. Pedal hair sparse. Lower extremity skin temperature gradient within normal limits. No pain with calf compression b/l. No edema noted b/l lower extremities. No cyanosis or clubbing noted.  Neurologic Normal speech. Oriented to person, place, and time. Protective sensation intact 5/5 intact bilaterally with 10g monofilament b/l. Vibratory sensation intact b/l.  Dermatologic Pedal skin with normal turgor, texture and tone bilaterally. No open wounds bilaterally. No interdigital macerations bilaterally. Toenails 1-5 right, L 2nd toe, L 3rd toe and L 5th toe elongated, discolored, dystrophic, thickened, and crumbly with subungual debris and tenderness to dorsal palpation. Anonychia noted L hallux and L 4th toe. Nailbed(s) epithelialized. No acute changes.  Orthopedic: Normal muscle strength 5/5 to all lower extremity muscle groups bilaterally. No  pain crepitus or joint limitation noted with ROM b/l. Limited joint ROM to the first MPJ right foot.   Radiographs: None Assessment:   1. Pain due to onychomycosis of toenails of both feet   2. Hallux limitus of right foot   3. Type 2 diabetes mellitus without complication, with long-term current use of insulin ([place])    Plan:  Patient was evaluated and treated and all questions answered.  Onychomycosis with pain -Nails palliatively debridement as below -Educated on self-care  Procedure: Nail Debridement Rationale: Pain Type of Debridement: manual, sharp debridement. Instrumentation: Nail nipper, rotary burr. Number of Nails: 8 -Examined patient. -Patient to continue soft, supportive shoe gear daily. -Toenails 1-5 right, L 2nd toe, L 3rd toe and L 5th toe debrided in length and girth without iatrogenic bleeding with sterile nail nipper and dremel. Advised him left 4th digit toenail will grow back in 3-4 months. Call if he has any problems in the interim. -Patient to report any pedal injuries to medical professional immediately. -Patient/POA to call should there be question/concern in the interim.  Return in about 3 months (around 12/01/2020).  Marzetta Board, DPM

## 2020-11-14 DIAGNOSIS — C61 Malignant neoplasm of prostate: Secondary | ICD-10-CM | POA: Diagnosis not present

## 2020-11-14 DIAGNOSIS — I2510 Atherosclerotic heart disease of native coronary artery without angina pectoris: Secondary | ICD-10-CM | POA: Diagnosis not present

## 2020-11-14 DIAGNOSIS — E1151 Type 2 diabetes mellitus with diabetic peripheral angiopathy without gangrene: Secondary | ICD-10-CM | POA: Diagnosis not present

## 2020-11-14 DIAGNOSIS — N182 Chronic kidney disease, stage 2 (mild): Secondary | ICD-10-CM | POA: Diagnosis not present

## 2020-11-14 DIAGNOSIS — E7800 Pure hypercholesterolemia, unspecified: Secondary | ICD-10-CM | POA: Diagnosis not present

## 2020-11-14 DIAGNOSIS — I1310 Hypertensive heart and chronic kidney disease without heart failure, with stage 1 through stage 4 chronic kidney disease, or unspecified chronic kidney disease: Secondary | ICD-10-CM | POA: Diagnosis not present

## 2020-11-14 DIAGNOSIS — Z794 Long term (current) use of insulin: Secondary | ICD-10-CM | POA: Diagnosis not present

## 2020-11-14 DIAGNOSIS — E1129 Type 2 diabetes mellitus with other diabetic kidney complication: Secondary | ICD-10-CM | POA: Diagnosis not present

## 2020-11-14 DIAGNOSIS — R809 Proteinuria, unspecified: Secondary | ICD-10-CM | POA: Diagnosis not present

## 2020-11-14 DIAGNOSIS — I493 Ventricular premature depolarization: Secondary | ICD-10-CM | POA: Diagnosis not present

## 2020-12-03 ENCOUNTER — Other Ambulatory Visit: Payer: Self-pay

## 2020-12-03 ENCOUNTER — Encounter: Payer: Self-pay | Admitting: Podiatry

## 2020-12-03 ENCOUNTER — Ambulatory Visit (INDEPENDENT_AMBULATORY_CARE_PROVIDER_SITE_OTHER): Payer: Medicare HMO | Admitting: Podiatry

## 2020-12-03 DIAGNOSIS — M79675 Pain in left toe(s): Secondary | ICD-10-CM

## 2020-12-03 DIAGNOSIS — B351 Tinea unguium: Secondary | ICD-10-CM

## 2020-12-03 DIAGNOSIS — Z794 Long term (current) use of insulin: Secondary | ICD-10-CM

## 2020-12-03 DIAGNOSIS — E119 Type 2 diabetes mellitus without complications: Secondary | ICD-10-CM

## 2020-12-03 DIAGNOSIS — M79674 Pain in right toe(s): Secondary | ICD-10-CM

## 2020-12-07 NOTE — Progress Notes (Signed)
Subjective: Ryan Saunders is a pleasant 75 y.o. male patient seen today for preventative diabetic foot care and painful thick toenails that are difficult to trim. Pain interferes with ambulation. Aggravating factors include wearing enclosed shoe gear. Pain is relieved with periodic professional debridement.   Patient states their blood glucose was 110 mg/dl this morning.  He notes no new pedal problems on today's visit.  PCP is Tisovec, Fransico Him, MD. Last visit was: 11/14/2020.  Allergies  Allergen Reactions   Bee Venom     Other reaction(s): swelling in lips and mouth   Other Other (See Comments)    Objective: Physical Exam  General: [name] is a pleasant 75 y.o. Caucasian male, WD, WN in NAD. AAO x 3.   Vascular:  Capillary refill time to digits immediate b/l. Palpable DP pulse(s) b/l lower extremities Palpable PT pulse(s) b/l lower extremities Pedal hair sparse. Lower extremity skin temperature gradient within normal limits. No edema noted b/l lower extremities.  Dermatological:  Pedal skin with normal turgor, texture and tone b/l lower extremities. No open wounds b/l lower extremities. No interdigital macerations b/l lower extremities. Toenails 1-5 right, L 2nd toe, L 3rd toe, and L 5th toe elongated, discolored, dystrophic, thickened, and crumbly with subungual debris and tenderness to dorsal palpation. Anonychia noted L hallux and L 4th toe. Nailbed(s) epithelialized.   Musculoskeletal:  Normal muscle strength 5/5 to all lower extremity muscle groups bilaterally. Limited joint ROM to the 1st MPJ right foot.  Neurological:  Protective sensation intact 5/5 intact bilaterally with 10g monofilament b/l. Vibratory sensation intact b/l.  Assessment and Plan:  1. Pain due to onychomycosis of toenails of both feet   2. Type 2 diabetes mellitus without complication, with long-term current use of insulin ([place])      -Examined patient. -No new findings. No new  orders. -Patient to continue soft, supportive shoe gear daily. -Toenails 1-5 right, L 2nd toe, L 3rd toe, and L 5th toe debrided in length and girth without iatrogenic bleeding with sterile nail nipper and dremel.  -Patient to report any pedal injuries to medical professional immediately. -Patient/POA to call should there be question/concern in the interim.  Return in about 3 months (around 03/05/2021).  Marzetta Board, DPM

## 2020-12-26 DIAGNOSIS — I1310 Hypertensive heart and chronic kidney disease without heart failure, with stage 1 through stage 4 chronic kidney disease, or unspecified chronic kidney disease: Secondary | ICD-10-CM | POA: Diagnosis not present

## 2020-12-26 DIAGNOSIS — E1129 Type 2 diabetes mellitus with other diabetic kidney complication: Secondary | ICD-10-CM | POA: Diagnosis not present

## 2020-12-26 DIAGNOSIS — N182 Chronic kidney disease, stage 2 (mild): Secondary | ICD-10-CM | POA: Diagnosis not present

## 2020-12-26 DIAGNOSIS — I2510 Atherosclerotic heart disease of native coronary artery without angina pectoris: Secondary | ICD-10-CM | POA: Diagnosis not present

## 2020-12-26 DIAGNOSIS — Z794 Long term (current) use of insulin: Secondary | ICD-10-CM | POA: Diagnosis not present

## 2021-01-14 DIAGNOSIS — C61 Malignant neoplasm of prostate: Secondary | ICD-10-CM | POA: Diagnosis not present

## 2021-01-21 DIAGNOSIS — N130 Hydronephrosis with ureteropelvic junction obstruction: Secondary | ICD-10-CM | POA: Diagnosis not present

## 2021-01-21 DIAGNOSIS — C61 Malignant neoplasm of prostate: Secondary | ICD-10-CM | POA: Diagnosis not present

## 2021-01-21 DIAGNOSIS — M8580 Other specified disorders of bone density and structure, unspecified site: Secondary | ICD-10-CM | POA: Diagnosis not present

## 2021-02-27 DIAGNOSIS — E1129 Type 2 diabetes mellitus with other diabetic kidney complication: Secondary | ICD-10-CM | POA: Diagnosis not present

## 2021-02-27 DIAGNOSIS — Z794 Long term (current) use of insulin: Secondary | ICD-10-CM | POA: Diagnosis not present

## 2021-02-27 DIAGNOSIS — N182 Chronic kidney disease, stage 2 (mild): Secondary | ICD-10-CM | POA: Diagnosis not present

## 2021-02-27 DIAGNOSIS — I2510 Atherosclerotic heart disease of native coronary artery without angina pectoris: Secondary | ICD-10-CM | POA: Diagnosis not present

## 2021-02-27 DIAGNOSIS — I1310 Hypertensive heart and chronic kidney disease without heart failure, with stage 1 through stage 4 chronic kidney disease, or unspecified chronic kidney disease: Secondary | ICD-10-CM | POA: Diagnosis not present

## 2021-03-06 ENCOUNTER — Encounter: Payer: Self-pay | Admitting: Podiatry

## 2021-03-06 ENCOUNTER — Ambulatory Visit: Payer: Medicare Other | Admitting: Cardiovascular Disease

## 2021-03-06 ENCOUNTER — Other Ambulatory Visit: Payer: Self-pay

## 2021-03-06 ENCOUNTER — Ambulatory Visit: Payer: Medicare HMO | Admitting: Podiatry

## 2021-03-06 DIAGNOSIS — L84 Corns and callosities: Secondary | ICD-10-CM

## 2021-03-06 DIAGNOSIS — E119 Type 2 diabetes mellitus without complications: Secondary | ICD-10-CM | POA: Diagnosis not present

## 2021-03-06 DIAGNOSIS — M205X1 Other deformities of toe(s) (acquired), right foot: Secondary | ICD-10-CM | POA: Diagnosis not present

## 2021-03-06 DIAGNOSIS — M79674 Pain in right toe(s): Secondary | ICD-10-CM

## 2021-03-06 DIAGNOSIS — B351 Tinea unguium: Secondary | ICD-10-CM

## 2021-03-06 DIAGNOSIS — M79675 Pain in left toe(s): Secondary | ICD-10-CM | POA: Diagnosis not present

## 2021-03-06 NOTE — Progress Notes (Signed)
ANNUAL DIABETIC FOOT EXAM  Subjective: Ryan Saunders presents today for for annual diabetic foot examination and painful elongated mycotic toenails 1-5 bilaterally which are tender when wearing enclosed shoe gear. Pain is relieved with periodic professional debridement..  Patient relates 20 year h/o diabetes.  Patient denies any h/o foot wounds.  Patient denies any numbness, tingling, burning, or pins/needle sensation in feet.  Patient's blood sugar was 83 mg/dl today.   Tisovec, Fransico Him, MD is patient's PCP. Last visit was 11/14/2020.  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   Long term (current) use of insulin ([place]) 03/20/2015   Ventricular premature depolarization 03/20/2015   Malignant neoplasm of prostate ([place]) 02/08/2015   CAD (coronary artery disease) 04/21/2014   HTN (hypertension) 04/21/2014   Hyperlipidemia 04/21/2014   Angiopathy, peripheral ([place]) 03/14/2014   Benign neoplasm of colon 09/21/2013   Microalbuminuria 03/01/2013   Diabetes mellitus type 2, uncontrolled 08/16/2010   Nicotine dependence 02/11/2010   Health examination of defined subpopulation 02/08/2009   Benign essential HTN 12/28/2008   CD (Crohn's disease) ([place]) 12/28/2008   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     B Complex Vitamins (B COMPLEX PO) Take 1 tablet by mouth daily.      BD PEN NEEDLE NANO U/F 32G X 4 MM MISC USE TO INJECT INSULIN TWICE DAILY     CALCIUM PO Take 1 tablet by mouth daily.     dapagliflozin propanediol (FARXIGA) 10 MG TABS tablet 1 tablet     Leuprolide Acetate, 6 Month, (LUPRON DEPOT, 52-MONTH, IM) Inject 1 Dose into the muscle every 6 (six) months.     LEVEMIR FLEXTOUCH 100 UNIT/ML Pen Inject 50 Units into the skin daily.      lisinopril (ZESTRIL) 20 MG tablet Take 20 mg by mouth daily.     Multiple Vitamin (MULTIVITAMIN) tablet Take 1 tablet by mouth daily. AREDS     ONETOUCH VERIO test strip 2 (two) times daily. test blood sugar     PFIZER-BIONT COVID-19 VAC-TRIS SUSP injection      VICTOZA 18 MG/3ML SOPN Inject 18 mg into the skin daily.   6   ZETIA 10 MG tablet Take 10 mg by mouth daily.  3   No current facility-administered medications on file prior to visit.    Allergies  Allergen Reactions   Bee Venom     Other reaction(s): swelling in lips and mouth   Other Other (See Comments)   Social History   Occupational History   Occupation: retired  Tobacco Use   Smoking status: Former    Packs/day: 2.00    Years: 50.00    Pack years: 100.00  Types: Cigarettes    Quit date: 02/08/2004    Years since quitting: 17.0   Smokeless tobacco: Never  Substance and Sexual Activity   Alcohol use: Yes    Alcohol/week: 0.0 standard drinks    Comment: SOCAILLY   Drug use: No   Sexual activity: Not on file   Family History  Problem Relation Age of Onset   CVA Mother    Heart attack Mother    Crohn's disease Mother    Diabetes Father    Heart failure Paternal Grandmother    Immunization History  Administered Date(s) Administered   PFIZER(Purple Top)SARS-COV-2 Vaccination 05/27/2019, 06/19/2019     Review of Systems: Negative except as noted in the HPI.   Objective: There were no vitals filed for this  visit.  Ryan Saunders is a pleasant 75 y.o. male in NAD. AAO X 3.  Vascular Examination: Capillary refill time to digits immediate b/l. Palpable DP pulse(s) right lower extremity Palpable PT pulse(s) left lower extremity Faintly palpable DP pulse(s) LLE. Faintly palpable PT pulse(s) right lower extremity. Pedal hair sparse. No pain with calf compression b/l. No edema noted b/l LE.  Dermatological Examination: Pedal skin is warm and supple b/l LE. No open wounds b/l LE. No interdigital macerations noted b/l LE. Toenails 2-5 bilaterally and R hallux elongated, discolored, dystrophic, thickened, and crumbly with subungual debris and tenderness to dorsal palpation. Anonychia noted L hallux. Nailbed(s) epithelialized.  Hyperkeratotic lesion(s) bilateral great toes.  No erythema, no edema, no drainage, no fluctuance.  Musculoskeletal Examination: Muscle strength 5/5 to all lower extremity muscle groups bilaterally. No gross bony deformities bilaterally. Limited joint ROM to the 1st MPJ right foot.  Footwear Assessment: Does the patient wear appropriate shoes? Yes. Does the patient need inserts/orthotics? No.  Neurological Examination: Protective sensation intact 5/5 intact bilaterally with 10g monofilament b/l. Vibratory sensation intact b/l.  Assessment: 1. Pain due to onychomycosis of toenails of both feet   2. Callus   3. Hallux limitus of right foot   4. Controlled type 2 diabetes mellitus without complication, without long-term current use of insulin ([place])   5. Encounter for diabetic foot exam ([place])     ADA Risk Categorization: Low Risk :  Patient has all of the following: Intact protective sensation No prior foot ulcer  No severe deformity Pedal pulses present  Plan: -Diabetic foot examination performed today. -Continue diabetic foot care principles: inspect feet daily, monitor glucose as recommended by PCP and/or Endocrinologist, and follow prescribed diet per PCP,  Endocrinologist and/or dietician. -Patient to continue soft, supportive shoe gear daily. -Mycotic toenails 2-5 bilaterally and R hallux were debrided in length and girth with sterile nail nippers and dremel without iatrogenic bleeding. -Callus(es) bilateral great toes pared utilizing sterile scalpel blade without complication or incident. Total number debrided =2. -Patient/POA to call should there be question/concern in the interim.  Return in about 3 months (around 06/06/2021).  Marzetta Board, DPM

## 2021-03-10 ENCOUNTER — Encounter: Payer: Self-pay | Admitting: Cardiovascular Disease

## 2021-03-10 NOTE — Progress Notes (Signed)
Livingston Diones Henri Date of Birth  02-Nov-1945       [facility]     [address], [place]    [place], [place]  [address]    [phone]        Fax  [phone]       Problem List: 1. CAD - mild by cath in 2007 2. Hypertension 3, hyperlipidemia 4. Diabetes Mellitus 5. Prostate cancer    Ryan Saunders is a 75 yo man who I have seen in the past.  He is not having any specific issues. He has not had any CP or dyspnea.  Exercises on occasion.    Plays golf,  Retired from [employer].  Oct. 6. 2016:  No CP , no dyspnea.  Has been having some palpitations. Has had some episodes of nausea and feeling poorly after exercising or being out in the heat.  His symptoms resolved over the next 30 minutes.  Has not tried to aggressively hydrate.   August 06, 2015:  [name] is doing ok. Still has palpitations. Has been drinking V8  on occasion. He also uses Nun tablets in his water.  Seems to be helping .  He has no symptoms ,   Can only tell that his heart is skipping when he takes his pulse.   August 07, 2016:  Seen for follow up of palpitations, weakness and volume depletion.  Doing great .    PSA has been increasing slowly .   September 25, 2017  [name] is seen today . No CP or dyspnea . Is on Lupron shots now ,  Has had cryoablation,  Has had radiation implants  Has had 2 separate prostate cancers .   Not a candidate for XRT  BP at home is well controlled.   Avoids salt,    Has lost some weight . Gets regular exercise  Is followed by Dr. Osborne Casco.   Nov. 22, 2021: [name] is seen today for follow up of his palpitations. Exercising well .  Lipids are managed by dr. Osborne Casco.  No complaints   Nov. 28, 2022: Seen with Wife, Ane Payment is seen today for follow up of his CAD , HLD, palpitations Cath in 2007 showed mild CAD Has prostate cancer Exercises regularly  No CP or dyspnea  Labs from his primary medical doctor from January, 2022 shows a total  cholesterol of 112.  HDL is 37, LDL is 59, triglyceride level is 80.  Current Outpatient Medications  Medication Sig Dispense Refill   atorvastatin (LIPITOR) 80 MG tablet Take 80 mg by mouth daily.     B Complex Vitamins (B COMPLEX PO) Take 1 tablet by mouth daily.      BD PEN NEEDLE NANO U/F 32G X 4 MM MISC USE TO INJECT INSULIN TWICE DAILY     CALCIUM PO Take 1 tablet by mouth daily.     dapagliflozin propanediol (FARXIGA) 10 MG TABS tablet 1 tablet     leuprolide, 6 Month, (LEUPROLIDE ACETATE, 6 MONTH,) 45 MG injection inject 45mg      LEVEMIR FLEXTOUCH 100 UNIT/ML Pen Inject 50 Units into the skin daily.      lisinopril (ZESTRIL) 20 MG tablet Take 20 mg by mouth daily.     Multiple Vitamin (MULTIVITAMIN) tablet Take 1 tablet by mouth daily. AREDS     Multiple Vitamins-Minerals (PRESERVISION AREDS 2) CAPS Take 1 capsule by mouth daily.     ONETOUCH VERIO test strip 2 (two) times daily.  test blood sugar     PFIZER-BIONT COVID-19 VAC-TRIS SUSP injection      VICTOZA 18 MG/3ML SOPN Inject 18 mg into the skin daily.   6   ZETIA 10 MG tablet Take 10 mg by mouth daily.  3   No current facility-administered medications for this visit.     Allergies  Allergen Reactions   Bee Venom     Other reaction(s): swelling in lips and mouth   Other Other (See Comments)    Past Medical History:  Diagnosis Date   Cancer ([place])    PROSTATE CANCER   Coronary artery disease    Crohn's disease ([place])    Hyperlipidemia    Hypertension    PVC (premature ventricular contraction)    PVD (peripheral vascular disease) ([place])     Past Surgical History:  Procedure Laterality Date   ANAL FISTULA REPAIR     CRYOTHERAPY  2003   every 7 years   [place]  2003   VASECTOMY  1972    Social History   Tobacco Use  Smoking Status Former   Packs/day: 2.00   Years: 50.00   Pack years: 100.00   Types: Cigarettes   Quit date: 02/08/2004   Years since quitting: 17.0  Smokeless  Tobacco Never    Social History   Substance and Sexual Activity  Alcohol Use Yes   Alcohol/week: 0.0 standard drinks   Comment: SOCAILLY    Family History  Problem Relation Age of Onset   CVA Mother    Heart attack Mother    Crohn's disease Mother    Diabetes Father    Heart failure Paternal Grandmother     Reviw of Systems:  Reviewed in the HPI.  All other systems are negative.   Physical Exam: Blood pressure 138/70, pulse (!) 51, height 5\' 11"  (1.803 m), weight 193 lb 3.2 oz (87.6 kg), SpO2 99 %.  GEN:  Well nourished, well developed in no acute distress HEENT: Normal NECK: No JVD; No carotid bruits LYMPHATICS: No lymphadenopathy CARDIAC: RRR , no murmurs, rubs, gallops, bradycardic   RESPIRATORY:  Clear to auscultation without rales, wheezing or rhonchi  ABDOMEN: Soft, non-tender, non-distended MUSCULOSKELETAL:  No edema; No deformity  SKIN: Warm and dry NEUROLOGIC:  Alert and oriented x 3    ECG:   Nov. 28, 2022:  sinus brady at 51, RBBB . No changes from previous ecg    Assessment / Plan:   1. CAD -  no angina ,  we discussed ASA therapy. He has no symptoms .   Cont current meds.    2. Hypertension -  BP is well controlled.  Cont meds. Advised continued exercise   3, hyperlipidemia -   lipids from Dr. Osborne Casco were reviewed.   LDL is 59 . Cont atorva and zetia    4. Diabetes Mellitus -  has started on Farxiga .   Is doing well on that.   5. Prostate cancer -  managed by urology.   Seems to be stable .   Will see him in 1 year.     Mertie Moores, MD  03/11/2021 8:39 AM    [address],  [place], [place]  [address] Pager [phone] Phone: [phone]; Fax: [phone]

## 2021-03-11 ENCOUNTER — Encounter: Payer: Self-pay | Admitting: Cardiovascular Disease

## 2021-03-11 ENCOUNTER — Other Ambulatory Visit: Payer: Self-pay

## 2021-03-11 ENCOUNTER — Ambulatory Visit: Payer: Medicare HMO | Admitting: Cardiovascular Disease

## 2021-03-11 VITALS — BP 138/70 | HR 51 | Ht 71.0 in | Wt 193.2 lb

## 2021-03-11 DIAGNOSIS — E785 Hyperlipidemia, unspecified: Secondary | ICD-10-CM

## 2021-03-11 DIAGNOSIS — I10 Essential (primary) hypertension: Secondary | ICD-10-CM

## 2021-03-11 DIAGNOSIS — I2510 Atherosclerotic heart disease of native coronary artery without angina pectoris: Secondary | ICD-10-CM | POA: Diagnosis not present

## 2021-03-11 NOTE — Patient Instructions (Signed)
Medication Instructions:  Your physician recommends that you continue on your current medications as directed. Please refer to the Current Medication list given to you today.  *If you need a refill on your cardiac medications before your next appointment, please call your pharmacy*   Lab Work: none If you have labs (blood work) drawn today and your tests are completely normal, you will receive your results only by: [facility] (if you have MyChart) OR A paper copy in the mail If you have any lab test that is abnormal or we need to change your treatment, we will call you to review the results.   Testing/Procedures: none   Follow-Up: At [facility], you and your health needs are our priority.  As part of our continuing mission to provide you with exceptional heart care, we have created designated Provider Care Teams.  These Care Teams include your primary Cardiologist (physician) and Advanced Practice Providers (APPs -  Physician Assistants and Nurse Practitioners) who all work together to provide you with the care you need, when you need it.  We recommend signing up for the patient portal called "MyChart".  Sign up information is provided on this After Visit Summary.  MyChart is used to connect with patients for Virtual Visits (Telemedicine).  Patients are able to view lab/test results, encounter notes, upcoming appointments, etc.  Non-urgent messages can be sent to your provider as well.   To learn more about what you can do with MyChart, go to [email or web address].    Your next appointment:   12 month(s)  The format for your next appointment:   In Person  Provider:   Mertie Moores, MD     Other Instructions

## 2021-05-20 DIAGNOSIS — I2510 Atherosclerotic heart disease of native coronary artery without angina pectoris: Secondary | ICD-10-CM | POA: Diagnosis not present

## 2021-05-20 DIAGNOSIS — E7800 Pure hypercholesterolemia, unspecified: Secondary | ICD-10-CM | POA: Diagnosis not present

## 2021-05-20 DIAGNOSIS — E1129 Type 2 diabetes mellitus with other diabetic kidney complication: Secondary | ICD-10-CM | POA: Diagnosis not present

## 2021-05-20 DIAGNOSIS — Z125 Encounter for screening for malignant neoplasm of prostate: Secondary | ICD-10-CM | POA: Diagnosis not present

## 2021-05-27 DIAGNOSIS — Z0000 Encounter for general adult medical examination without abnormal findings: Secondary | ICD-10-CM | POA: Diagnosis not present

## 2021-05-27 DIAGNOSIS — E1151 Type 2 diabetes mellitus with diabetic peripheral angiopathy without gangrene: Secondary | ICD-10-CM | POA: Diagnosis not present

## 2021-05-27 DIAGNOSIS — I2510 Atherosclerotic heart disease of native coronary artery without angina pectoris: Secondary | ICD-10-CM | POA: Diagnosis not present

## 2021-05-27 DIAGNOSIS — R82998 Other abnormal findings in urine: Secondary | ICD-10-CM | POA: Diagnosis not present

## 2021-05-27 DIAGNOSIS — Z1339 Encounter for screening examination for other mental health and behavioral disorders: Secondary | ICD-10-CM | POA: Diagnosis not present

## 2021-05-27 DIAGNOSIS — I739 Peripheral vascular disease, unspecified: Secondary | ICD-10-CM | POA: Diagnosis not present

## 2021-05-27 DIAGNOSIS — I1310 Hypertensive heart and chronic kidney disease without heart failure, with stage 1 through stage 4 chronic kidney disease, or unspecified chronic kidney disease: Secondary | ICD-10-CM | POA: Diagnosis not present

## 2021-05-27 DIAGNOSIS — E1129 Type 2 diabetes mellitus with other diabetic kidney complication: Secondary | ICD-10-CM | POA: Diagnosis not present

## 2021-05-27 DIAGNOSIS — Z1331 Encounter for screening for depression: Secondary | ICD-10-CM | POA: Diagnosis not present

## 2021-05-27 DIAGNOSIS — Z794 Long term (current) use of insulin: Secondary | ICD-10-CM | POA: Diagnosis not present

## 2021-05-27 DIAGNOSIS — E7800 Pure hypercholesterolemia, unspecified: Secondary | ICD-10-CM | POA: Diagnosis not present

## 2021-05-27 DIAGNOSIS — N182 Chronic kidney disease, stage 2 (mild): Secondary | ICD-10-CM | POA: Diagnosis not present

## 2021-05-27 DIAGNOSIS — R809 Proteinuria, unspecified: Secondary | ICD-10-CM | POA: Diagnosis not present

## 2021-06-17 ENCOUNTER — Other Ambulatory Visit: Payer: Self-pay

## 2021-06-17 ENCOUNTER — Encounter: Payer: Self-pay | Admitting: Podiatry

## 2021-06-17 ENCOUNTER — Ambulatory Visit (INDEPENDENT_AMBULATORY_CARE_PROVIDER_SITE_OTHER): Payer: Medicare HMO | Admitting: Podiatry

## 2021-06-17 DIAGNOSIS — L600 Ingrowing nail: Secondary | ICD-10-CM

## 2021-06-17 DIAGNOSIS — M79675 Pain in left toe(s): Secondary | ICD-10-CM

## 2021-06-17 DIAGNOSIS — B351 Tinea unguium: Secondary | ICD-10-CM

## 2021-06-17 DIAGNOSIS — Z794 Long term (current) use of insulin: Secondary | ICD-10-CM | POA: Diagnosis not present

## 2021-06-17 DIAGNOSIS — E119 Type 2 diabetes mellitus without complications: Secondary | ICD-10-CM

## 2021-06-17 DIAGNOSIS — M79674 Pain in right toe(s): Secondary | ICD-10-CM

## 2021-06-17 DIAGNOSIS — L84 Corns and callosities: Secondary | ICD-10-CM

## 2021-06-17 NOTE — Patient Instructions (Signed)
EPSOM SALT FOOT SOAK INSTRUCTIONS  *IF YOU HAVE BEEN PRESCRIBED ANTIBIOTICS, TAKE AS INSTRUCTED UNTIL ALL ARE GONE*  Shopping List:  A. Plain epsom salt (not scented) B. Neosporin Cream/Ointment or Bacitracin Cream/Ointment (or prescribed antiobiotic drops/cream/ointment) C. 1-inch fabric band-aids   Place 1/4 cup of epsom salts in 2 quarts of warm tap water. IF YOU ARE DIABETIC, OR HAVE NEUROPATHY, CHECK THE TEMPERATURE OF THE WATER WITH YOUR ELBOW.   Submerge your foot/feet in the solution and soak for 10-15 minutes.      3.  Next, remove your foot/feet from solution, blot dry the affected area.    4.  Apply light amount of antibiotic cream/ointment and cover with fabric band-aid .  5.  This soak should be done once a day for 7 days.   6.  Monitor for any signs/symptoms of infection such as redness, swelling, odor, drainage, increased pain, or non-healing of digit.   7.  Please do not hesitate to call the office and speak to a Nurse or Doctor if you have questions.   8.  If you experience fever, chills, nightsweats, nausea or vomiting with worsening of digit/foot, please go to the emergency room.  

## 2021-06-23 ENCOUNTER — Encounter: Payer: Self-pay | Admitting: Cardiovascular Disease

## 2021-06-23 NOTE — Progress Notes (Signed)
? ? ? ?Ryan Saunders ?Date of Birth  07-17-1945 ?      ?[facility]     ?[place]. [address]   ? [place], [place]  [address]    ?[phone]        ?Fax  [phone]      ? ?Problem List: ?1. CAD - mild by cath in 2007 ?2. Hypertension ?3, hyperlipidemia ?4. Diabetes Mellitus ?5. Prostate cancer ?  ? ?Latrelle is a 76 yo man who I have seen in the past.  ?He is not having any specific issues. ?He has not had any CP or dyspnea.  ?Exercises on occasion.  ? ? ?Plays golf,  ?Retired from [employer]. ? ?Oct. 6. 2016: ? ?No CP , no dyspnea.  ?Has been having some palpitations. ?Has had some episodes of nausea and feeling poorly after exercising or being out in the heat.  ?His symptoms resolved over the next 30 minutes.  ?Has not tried to aggressively hydrate.  ? ?August 06, 2015: ? ?Ryan Saunders is doing ok. ?Still has palpitations. ?Has been drinking V8  on occasion. He also uses Nun tablets in his water.  ?Seems to be helping . ? ?He has no symptoms ,   Can only tell that his heart is skipping when he takes his pulse.  ? ?August 07, 2016: ? ?Seen for follow up of palpitations, weakness and volume depletion.  ?Doing great .    ?PSA has been increasing slowly .  ? ?September 25, 2017 ? ?Ryan Saunders is seen today . ?No CP or dyspnea . ?Is on Lupron shots now ,  Has had cryoablation,  Has had radiation implants  ?Has had 2 separate prostate cancers .   Not a candidate for XRT  ?BP at home is well controlled.   ?Avoids salt,    Has lost some weight . ?Gets regular exercise  ?Is followed by Dr. Osborne Casco.  ? ?Nov. 22, 2021: ?Ryan Saunders is seen today for follow up of his palpitations. ?Exercising well .  ?Lipids are managed by dr. Osborne Casco.  ?No complaints  ? ?Nov. 28, 2022: ?Seen with Wife, Ryan Saunders  ?Ryan Saunders is seen today for follow up of his CAD , HLD, palpitations ?Cath in 2007 showed mild CAD ?Has prostate cancer ?Exercises regularly  ?No CP or dyspnea  ?Labs from his primary medical doctor from January, 2022 shows a total  cholesterol of 112.  HDL is 37, LDL is 59, triglyceride level is 80. ? ?June 24, 2021 ? ?Ryan Saunders is seen today for pre op visit to toenail removal ?He was just seen 4 months ago ?His podiatrist Peter Garter, [facility])  wants to get  ABIs prior to removing toenail  ?Exercising ok ?Has occasionally has tingling in his hands and feet.  ?No CP , no dyspnea  ? ? ? ? ?Current Outpatient Medications  ?Medication Sig Dispense Refill  ? atorvastatin (LIPITOR) 80 MG tablet Take 80 mg by mouth daily.    ? B Complex Vitamins (B COMPLEX PO) Take 1 tablet by mouth daily.     ? BD PEN NEEDLE NANO U/F 32G X 4 MM MISC USE TO INJECT INSULIN TWICE DAILY    ? CALCIUM PO Take 1 tablet by mouth daily.    ? dapagliflozin propanediol (FARXIGA) 10 MG TABS tablet 1 tablet    ? leuprolide, 6 Month, (LEUPROLIDE ACETATE, 6 MONTH,) 45 MG injection inject '45mg'$     ? LEVEMIR FLEXTOUCH 100 UNIT/ML Pen Inject  90 Units into the skin daily.    ? lisinopril (ZESTRIL) 20 MG tablet Take 20 mg by mouth daily.    ? Multiple Vitamin (MULTIVITAMIN) tablet Take 1 tablet by mouth daily. AREDS    ? Multiple Vitamins-Minerals (PRESERVISION AREDS 2) CAPS Take 1 capsule by mouth daily.    ? ONETOUCH VERIO test strip 2 (two) times daily. test blood sugar    ? PFIZER-BIONT COVID-19 VAC-TRIS SUSP injection     ? VICTOZA 18 MG/3ML SOPN Inject 18 mg into the skin daily.   6  ? ZETIA 10 MG tablet Take 10 mg by mouth daily.  3  ? ?No current facility-administered medications for this visit.  ? ? ? ?Allergies  ?Allergen Reactions  ? Bee Venom   ?  Other reaction(s): swelling in lips and mouth  ? Other Other (See Comments)  ? ? ?Past Medical History:  ?Diagnosis Date  ? Cancer [facility])   ? PROSTATE CANCER  ? Coronary artery disease   ? Crohn's disease ([place])   ? Hyperlipidemia   ? Hypertension   ? PVC (premature ventricular contraction)   ? PVD (peripheral vascular disease) ([place])   ? ? ?Past Surgical History:  ?Procedure Laterality Date  ? ANAL FISTULA REPAIR     ? CRYOTHERAPY  2003  ? every 7 years  ? INSERTION PROSTATE RADIATION SEED  2003  ? [place]  ? ? ?Social History  ? ?Tobacco Use  ?Smoking Status Former  ? Packs/day: 2.00  ? Years: 50.00  ? Pack years: 100.00  ? Types: Cigarettes  ? Quit date: 02/08/2004  ? Years since quitting: 17.3  ?Smokeless Tobacco Never  ? ? ?Social History  ? ?Substance and Sexual Activity  ?Alcohol Use Yes  ? Alcohol/week: 0.0 standard drinks  ? Comment: SOCAILLY  ? ? ?Family History  ?Problem Relation Age of Onset  ? CVA Mother   ? Heart attack Mother   ? Crohn's disease Mother   ? Diabetes Father   ? Heart failure Paternal Grandmother   ? ? ?Reviw of Systems:  ?Reviewed in the HPI.  All other systems are negative. ? ? ?Physical Exam: ?Blood pressure 116/68, pulse (!) 55, height 5' 11.5" (1.816 m), weight 192 lb 9.6 oz (87.4 kg), SpO2 97 %. ? ?GEN:  Well nourished, well developed in no acute distress ?HEENT: Normal ?NECK: No JVD; No carotid bruits ?LYMPHATICS: No lymphadenopathy ?CARDIAC: RRR , no murmurs, rubs, gallops ?RESPIRATORY:  Clear to auscultation without rales, wheezing or rhonchi  ?ABDOMEN: Soft, non-tender, non-distended ?MUSCULOSKELETAL: right great toe,  mild discoloration of his toe and nail. ?SKIN: Warm and dry ?NEUROLOGIC:  Alert and oriented x 3 ? ? ?ECG: June 24, 2021: Sinus bradycardia at 55.  Right bundle branch block. ? ?Assessment / Plan:  ? ?1. CAD -mild CAD.  He is not having any angina. ? ? ?2. Hypertension -blood pressure is well controlled. ? ? ? ? ?3, hyperlipidemia -   continue current medications. ? ? ? ?4.  Right great toe discoloration:  is seen Dr. Elisha Ponder [facility]. ?She has requested lower ext. arterial duplex  ? ?5. Prostate cancer - ? ?Will see him in 1 year.  ? ? ? ?Mertie Moores, MD  ?06/24/2021 2:16 PM    ?[facility] ?[address],  [address] ?[place], [place]  [address] ?Pager [phone] ?Phone: [phone]; Fax: [phone]  ? ?   ? ?

## 2021-06-23 NOTE — Progress Notes (Signed)
?  Subjective:  ?Patient ID: Ryan Saunders, male    DOB: August 03, 1945,  MRN: [id number] ? ?Ryan Saunders presents to clinic today for preventative diabetic foot care and callus(es) both feet and painful thick toenails that are difficult to trim. Painful toenails interfere with ambulation. Aggravating factors include wearing enclosed shoe gear. Pain is relieved with periodic professional debridement. Painful calluses are aggravated when weightbearing with and without shoegear. Pain is relieved with periodic professional debridement. ? ?Patient states blood glucose was 110 mg/dl today.   ? ?New problem(s):  ingrown toenail to the right great toe. Patient states toe is most painful when he plays golf. Denies any drainage, redness or swelling. He uses toe cap for relief. He has also been using Diabetic Foot Soak Effervescent Tablets Foot Care by [employer], which he purchased on [employer]. He would like to know what other options are available for the painful digit. ? ?PCP is Tisovec, Fransico Him, MD , and last visit was May 27, 2021. ? ?Allergies  ?Allergen Reactions  ? Bee Venom   ?  Other reaction(s): swelling in lips and mouth  ? Other Other (See Comments)  ? ? ?Review of Systems: Negative except as noted in the HPI. ? ?Objective: ?Vascular Examination: ?Capillary refill time to digits immediate b/l. Palpable DP pulse(s) right lower extremity. Palpable PT pulse(s) left lower extremity Faintly palpable DP pulse(s) LLE. Faintly palpable PT pulse(s) right lower extremity. Pedal hair sparse. No pain with calf compression b/l. No edema noted b/l LE. No ischemia or gangrene noted b/l LE.  ? ?Dermatological Examination: ?Pedal skin is warm and supple b/l LE. No open wounds b/l LE. No interdigital macerations noted b/l LE. Toenails 2-5 bilaterally and R hallux elongated, discolored, dystrophic, thickened, and crumbly with subungual debris and tenderness to dorsal palpation. Anonychia noted L hallux. Nailbed(s)  epithelialized.  Hyperkeratotic lesion(s) bilateral great toes.  No erythema, no edema, no drainage, no fluctuance. ? ?Musculoskeletal Examination: ?Muscle strength 5/5 to all lower extremity muscle groups bilaterally. No gross bony deformities bilaterally. Limited joint ROM to the 1st MPJ right foot. ? ?Neurological Examination: ?Protective sensation intact 5/5 intact bilaterally with 10g monofilament b/l. Vibratory sensation intact b/l. ? ?Assessment/Plan: ?1. Pain due to onychomycosis of toenails of both feet   ?2. Ingrown toenail without infection   ?3. Callus   ?4. Type 2 diabetes mellitus without complication, with long-term current use of insulin ([place])   ?  ?-We discussed his ingrown toenail of the right hallux in depth. Discussed elective permanent partial or nail avulsion procedure available. Recommended baseline ABIs to determine healing potential of procedure. He is in agreement. I have given him an order for bilateral lower extremity ABIs/TBIs  and he will take to his Cardiologist. In the event he is unable to have it done at his Cardiologist's office, I will send a new order for him. No invasive procedures performed on today's visit. ?-Toenails 2-5 bilaterally and L hallux debrided in length and girth without iatrogenic bleeding with sterile nail nipper and dremel.  ?-Offending nail border debrided and curretaged medial border right hallux utilizing sterile nail nipper and currette. Border(s) cleansed with alcohol and TAO applied. Dispensed written instructions for once daily epsom salt soaks for 7 days. ?-Patient/POA to call should there be question/concern in the interim.  ? ?Return in about 9 weeks (around 08/19/2021). ? ?Marzetta Board, DPM  ?

## 2021-06-24 ENCOUNTER — Encounter: Payer: Self-pay | Admitting: Cardiovascular Disease

## 2021-06-24 ENCOUNTER — Other Ambulatory Visit: Payer: Self-pay

## 2021-06-24 ENCOUNTER — Ambulatory Visit (INDEPENDENT_AMBULATORY_CARE_PROVIDER_SITE_OTHER): Payer: Medicare HMO | Admitting: Cardiovascular Disease

## 2021-06-24 VITALS — BP 116/68 | HR 55 | Ht 71.5 in | Wt 192.6 lb

## 2021-06-24 DIAGNOSIS — E785 Hyperlipidemia, unspecified: Secondary | ICD-10-CM | POA: Diagnosis not present

## 2021-06-24 DIAGNOSIS — I739 Peripheral vascular disease, unspecified: Secondary | ICD-10-CM | POA: Diagnosis not present

## 2021-06-24 NOTE — Patient Instructions (Signed)
Medication Instructions:  ?Your physician recommends that you continue on your current medications as directed. Please refer to the Current Medication list given to you today. ? ?*If you need a refill on your cardiac medications before your next appointment, please call your pharmacy* ? ? ?Lab Work: ?NONE ?If you have labs (blood work) drawn today and your tests are completely normal, you will receive your results only by: ?MyChart Message (if you have MyChart) OR ?A paper copy in the mail ?If you have any lab test that is abnormal or we need to change your treatment, we will call you to review the results. ? ? ?Testing/Procedures: ?Your physician has recommended for you to have a bilateral lower extremity arterial ultrasound doppler performed. ? ?Follow-Up: ?At [facility], you and your health needs are our priority.  As part of our continuing mission to provide you with exceptional heart care, we have created designated Provider Care Teams.  These Care Teams include your primary Cardiologist (physician) and Advanced Practice Providers (APPs -  Physician Assistants and Nurse Practitioners) who all work together to provide you with the care you need, when you need it. ? ?Your next appointment:   ?You will receive a letter in October for your follow-up appointment in November 2023. ? ?The format for your next appointment:   ?In Person ? ?Provider:   ?Mertie Moores, MD  ?

## 2021-07-10 ENCOUNTER — Other Ambulatory Visit (HOSPITAL_COMMUNITY): Payer: Self-pay | Admitting: Urology

## 2021-07-10 ENCOUNTER — Other Ambulatory Visit: Payer: Self-pay | Admitting: Urology

## 2021-07-10 DIAGNOSIS — C61 Malignant neoplasm of prostate: Secondary | ICD-10-CM

## 2021-07-11 ENCOUNTER — Other Ambulatory Visit: Payer: Self-pay | Admitting: Cardiovascular Disease

## 2021-07-11 DIAGNOSIS — I739 Peripheral vascular disease, unspecified: Secondary | ICD-10-CM

## 2021-07-15 DIAGNOSIS — C61 Malignant neoplasm of prostate: Secondary | ICD-10-CM | POA: Diagnosis not present

## 2021-07-18 DIAGNOSIS — I700 Atherosclerosis of aorta: Secondary | ICD-10-CM | POA: Diagnosis not present

## 2021-07-18 DIAGNOSIS — C61 Malignant neoplasm of prostate: Secondary | ICD-10-CM | POA: Diagnosis not present

## 2021-07-22 DIAGNOSIS — N130 Hydronephrosis with ureteropelvic junction obstruction: Secondary | ICD-10-CM | POA: Diagnosis not present

## 2021-07-22 DIAGNOSIS — C61 Malignant neoplasm of prostate: Secondary | ICD-10-CM | POA: Diagnosis not present

## 2021-07-22 DIAGNOSIS — M8580 Other specified disorders of bone density and structure, unspecified site: Secondary | ICD-10-CM | POA: Diagnosis not present

## 2021-07-22 DIAGNOSIS — Z5111 Encounter for antineoplastic chemotherapy: Secondary | ICD-10-CM | POA: Diagnosis not present

## 2021-07-25 ENCOUNTER — Ambulatory Visit (HOSPITAL_COMMUNITY)
Admission: RE | Admit: 2021-07-25 | Discharge: 2021-07-25 | Disposition: A | Discharge status: Home / Self Care | Payer: Medicare HMO | Source: Ambulatory Visit | Attending: Internal Medicine | Admitting: Internal Medicine

## 2021-07-25 DIAGNOSIS — I739 Peripheral vascular disease, unspecified: Secondary | ICD-10-CM | POA: Insufficient documentation

## 2021-07-26 ENCOUNTER — Telehealth (INDEPENDENT_AMBULATORY_CARE_PROVIDER_SITE_OTHER): Payer: Medicare HMO | Admitting: Podiatry

## 2021-07-26 NOTE — Telephone Encounter (Signed)
Spoke to patient and informed him of ABI results which showed no evidence of PAD. Confirmed by Dr. Acie Fredrickson. We discussed him having permanent partial or total nail avulsion for chronic ingrown toenail of the right hallux. He is asymptomatic today. If digit becomes symptomatic again, will refer to Dr. Milinda Pointer or Dr. Paulla Dolly for removal of symptomatic ingrown toenail. He is in agreement to defer for now. He will follow up with me at his next scheduled visit. We have ABIs which indicate he should be able to heal from the procedure should he desire to have it performed in the future. ?

## 2021-08-05 ENCOUNTER — Encounter (HOSPITAL_COMMUNITY)
Admission: RE | Admit: 2021-08-05 | Discharge: 2021-08-05 | Disposition: A | Discharge status: Home / Self Care | Payer: Medicare HMO | Source: Ambulatory Visit | Attending: Urology | Admitting: Urology

## 2021-08-05 DIAGNOSIS — C61 Malignant neoplasm of prostate: Secondary | ICD-10-CM | POA: Insufficient documentation

## 2021-08-05 DIAGNOSIS — M19012 Primary osteoarthritis, left shoulder: Secondary | ICD-10-CM | POA: Diagnosis not present

## 2021-08-05 DIAGNOSIS — M160 Bilateral primary osteoarthritis of hip: Secondary | ICD-10-CM | POA: Diagnosis not present

## 2021-08-05 DIAGNOSIS — M47816 Spondylosis without myelopathy or radiculopathy, lumbar region: Secondary | ICD-10-CM | POA: Diagnosis not present

## 2021-08-05 MED ORDER — TECHNETIUM TC 99M MEDRONATE IV KIT
20.6000 | PACK | Freq: Once | INTRAVENOUS | Status: AC
  Administered 2021-08-05: 20.6 via INTRAVENOUS

## 2021-08-19 ENCOUNTER — Ambulatory Visit: Payer: Medicare HMO | Admitting: Podiatry

## 2021-08-19 DIAGNOSIS — H26493 Other secondary cataract, bilateral: Secondary | ICD-10-CM | POA: Diagnosis not present

## 2021-08-19 DIAGNOSIS — E119 Type 2 diabetes mellitus without complications: Secondary | ICD-10-CM | POA: Diagnosis not present

## 2021-08-19 DIAGNOSIS — H353121 Nonexudative age-related macular degeneration, left eye, early dry stage: Secondary | ICD-10-CM | POA: Diagnosis not present

## 2021-08-19 DIAGNOSIS — H524 Presbyopia: Secondary | ICD-10-CM | POA: Diagnosis not present

## 2021-08-19 DIAGNOSIS — H40013 Open angle with borderline findings, low risk, bilateral: Secondary | ICD-10-CM | POA: Diagnosis not present

## 2021-08-20 ENCOUNTER — Encounter: Payer: Self-pay | Admitting: Podiatry

## 2021-08-20 ENCOUNTER — Ambulatory Visit: Payer: Medicare HMO | Admitting: Podiatry

## 2021-08-20 DIAGNOSIS — M79674 Pain in right toe(s): Secondary | ICD-10-CM | POA: Diagnosis not present

## 2021-08-20 DIAGNOSIS — B351 Tinea unguium: Secondary | ICD-10-CM | POA: Diagnosis not present

## 2021-08-20 DIAGNOSIS — E119 Type 2 diabetes mellitus without complications: Secondary | ICD-10-CM | POA: Diagnosis not present

## 2021-08-20 DIAGNOSIS — E785 Hyperlipidemia, unspecified: Secondary | ICD-10-CM

## 2021-08-20 DIAGNOSIS — E1121 Type 2 diabetes mellitus with diabetic nephropathy: Secondary | ICD-10-CM

## 2021-08-20 DIAGNOSIS — M79675 Pain in left toe(s): Secondary | ICD-10-CM

## 2021-08-20 DIAGNOSIS — L84 Corns and callosities: Secondary | ICD-10-CM | POA: Diagnosis not present

## 2021-08-20 DIAGNOSIS — Z794 Long term (current) use of insulin: Secondary | ICD-10-CM | POA: Diagnosis not present

## 2021-08-28 NOTE — Progress Notes (Signed)
?  Subjective:  ?Patient ID: Ryan Saunders, male    DOB: [birth year],  MRN: [id number] ? ?[place] Diones Vidovich presents to clinic today for preventative diabetic foot care and callus(es) bilateral great toes and painful thick toenails that are difficult to trim. Painful toenails interfere with ambulation. Aggravating factors include wearing enclosed shoe gear. Pain is relieved with periodic professional debridement. Painful calluses are aggravated when weightbearing with and without shoegear. Pain is relieved with periodic professional debridement. ? ?Patient states blood glucose was 110 mg/dl today.  Last known HgA1c was 6.0%. ? ?New problem(s): None.  ? ?PCP is Tisovec, Fransico Him, MD , and last visit was May 27, 2021. ? ?Allergies  ?Allergen Reactions  ? Bee Venom   ?  Other reaction(s): swelling in lips and mouth  ? Other Other (See Comments)  ? ? ?Review of Systems: Negative except as noted in the HPI. ? ?Objective: ? ?Vascular Examination: ?Capillary refill time to digits immediate b/l. Palpable DP pulse(s) right lower extremity. Palpable PT pulse(s) left lower extremity Faintly palpable DP pulse(s) LLE. Faintly palpable PT pulse(s) right lower extremity. Pedal hair sparse. No pain with calf compression b/l. No edema noted b/l LE. No ischemia or gangrene noted b/l LE.  ? ?Dermatological Examination: ?Pedal skin is warm and supple b/l LE. No open wounds b/l LE. No interdigital macerations noted b/l LE. Toenails 2-5 bilaterally and R hallux elongated, discolored, dystrophic, thickened, and crumbly with subungual debris and tenderness to dorsal palpation. Anonychia noted L hallux. Nailbed(s) epithelialized.  Hyperkeratotic lesion(s) bilateral great toes.  No erythema, no edema, no drainage, no fluctuance. ? ?Musculoskeletal Examination: ?Muscle strength 5/5 to all lower extremity muscle groups bilaterally. No gross bony deformities bilaterally. Limited joint ROM to the 1st MPJ right foot. ? ?Neurological  Examination: ?Protective sensation intact 5/5 intact bilaterally with 10g monofilament b/l. Vibratory sensation intact b/l. ? ?Assessment/Plan: ?1. Pain due to onychomycosis of toenails of both feet   ?2. Callus   ?3. Type 2 diabetes mellitus without complication, with long-term current use of insulin ([place])   ?  ?-Examined patient. ?-Patient to continue soft, supportive shoe gear daily. ?-Toenails 1-5 bilaterally debrided in length and girth without iatrogenic bleeding with sterile nail nipper and dremel.  ?-Callus(es) bilateral great toes pared utilizing sterile scalpel blade without complication or incident. Total number debrided =2. ?-Patient/POA to call should there be question/concern in the interim.  ? ?Return in about 9 weeks (around 10/22/2021). ? ?Marzetta Board, DPM  ?

## 2021-10-22 ENCOUNTER — Encounter: Payer: Self-pay | Admitting: Podiatry

## 2021-10-22 ENCOUNTER — Ambulatory Visit: Payer: Medicare HMO | Admitting: Podiatry

## 2021-10-22 DIAGNOSIS — M79674 Pain in right toe(s): Secondary | ICD-10-CM

## 2021-10-22 DIAGNOSIS — Z794 Long term (current) use of insulin: Secondary | ICD-10-CM | POA: Diagnosis not present

## 2021-10-22 DIAGNOSIS — B351 Tinea unguium: Secondary | ICD-10-CM

## 2021-10-22 DIAGNOSIS — L84 Corns and callosities: Secondary | ICD-10-CM

## 2021-10-22 DIAGNOSIS — M79675 Pain in left toe(s): Secondary | ICD-10-CM

## 2021-10-22 DIAGNOSIS — E119 Type 2 diabetes mellitus without complications: Secondary | ICD-10-CM

## 2021-10-27 NOTE — Progress Notes (Signed)
  Subjective:  Patient ID: Ryan Saunders, male    DOB: Mar 11, 1946,  MRN: [id number]  Ryan Saunders presents to clinic today for preventative diabetic foot care and callus(es) b/l lower extremities and painful thick toenails that are difficult to trim. Painful toenails interfere with ambulation. Aggravating factors include wearing enclosed shoe gear. Pain is relieved with periodic professional debridement. Painful calluses are aggravated when weightbearing with and without shoegear. Pain is relieved with periodic professional debridement.  Patient states blood glucose was 104 mg/dl today.  Last A1c was 6.0%.  New problem(s): None.   PCP is Tisovec, Fransico Him, MD , and last visit was  May 28, 2011  Allergies  Allergen Reactions   Bee Venom     Other reaction(s): swelling in lips and mouth   Other Other (See Comments)    Review of Systems: Negative except as noted in the HPI.  Objective: No changes noted in today's physical examination. Mr. Ryan Saunders is a pleasant 76 y.o. male, WD, WN in NAD. AAO x 3.  Vascular Examination: Capillary refill time to digits immediate b/l. Palpable DP pulse(s) right lower extremity. Palpable PT pulse(s) left lower extremity Faintly palpable DP pulse(s) LLE. Faintly palpable PT pulse(s) right lower extremity. Pedal hair sparse. No pain with calf compression b/l. No edema noted b/l LE. No ischemia or gangrene noted b/l LE.   Dermatological Examination: Pedal skin is warm and supple b/l LE. No open wounds b/l LE. No interdigital macerations noted b/l LE. Toenails 2-5 bilaterally and R hallux elongated, discolored, dystrophic, thickened, and crumbly with subungual debris and tenderness to dorsal palpation. Anonychia noted L hallux. Nailbed(s) epithelialized.  Hyperkeratotic lesion(s) bilateral great toes.  No erythema, no edema, no drainage, no fluctuance.  Musculoskeletal Examination: Muscle strength 5/5 to all lower extremity muscle groups  bilaterally. No gross bony deformities bilaterally. Limited joint ROM to the 1st MPJ right foot.  Neurological Examination: Protective sensation intact 5/5 intact bilaterally with 10g monofilament b/l. Vibratory sensation intact b/l. Assessment/Plan: 1. Pain due to onychomycosis of toenails of both feet   2. Callus   3. Type 2 diabetes mellitus without complication, with long-term current use of insulin ([place])      -Patient was evaluated and treated. All patient's and/or POA's questions/concerns answered on today's visit. -We discussed his ABI results which were normal. Will defer matrixectomy right great toe due to being asymptomatic. -Mycotic toenails 2-5 bilaterally and R hallux were debrided in length and girth with sterile nail nippers and dremel without iatrogenic bleeding. -Callus(es) bilateral great toes pared utilizing sterile scalpel blade without complication or incident. Total number debrided =2. -Patient/POA to call should there be question/concern in the interim.   Return in about 3 months (around 01/22/2022).  Marzetta Board, DPM

## 2021-11-07 ENCOUNTER — Ambulatory Visit
Admission: RE | Admit: 2021-11-07 | Discharge: 2021-11-07 | Disposition: A | Discharge status: Home / Self Care | Payer: Medicare HMO | Source: Ambulatory Visit | Attending: Physician Assistant | Admitting: Physician Assistant

## 2021-11-07 VITALS — BP 140/56 | HR 57 | Temp 97.6°F | Resp 18

## 2021-11-07 DIAGNOSIS — L309 Dermatitis, unspecified: Secondary | ICD-10-CM | POA: Diagnosis not present

## 2021-11-07 MED ORDER — METHYLPREDNISOLONE ACETATE 40 MG/ML IJ SUSP
40.0000 mg | Freq: Once | INTRAMUSCULAR | Status: DC
Start: 2021-11-07 — End: 2021-11-07

## 2021-11-07 MED ORDER — PREDNISONE 20 MG PO TABS: 40.0000 mg | ORAL_TABLET | Freq: Every day | ORAL | 0 refills | Status: AC

## 2021-11-07 NOTE — ED Provider Notes (Signed)
[facility]    CSN: [id number] Arrival date & time: 11/07/21  3235      History   Chief Complaint Chief Complaint  Patient presents with   Rash    HPI Ryan Saunders is a 76 y.o. male.   Patient here today for evaluation of rash to his left arm and left leg that started after he had been working outside 2 days ago.  He reports that he knelt down on the grass to do work and an hour later started to feel swelling and itching to his arm and leg.  He denies any known insect bite or sting.  He has not had pain other than "itching pain".  Topical Benadryl has helped somewhat.  He denies any fever, shortness of breath or trouble swallowing.  The history is provided by the patient.    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   Long term (current) use of insulin ([place]) 03/20/2015   Ventricular premature depolarization 03/20/2015   Malignant neoplasm of prostate ([place]) 02/08/2015   CAD (coronary artery disease) 04/21/2014   HTN (hypertension) 04/21/2014   Hyperlipidemia 04/21/2014   Angiopathy, peripheral ([place]) 03/14/2014   Benign neoplasm of colon 09/21/2013   Microalbuminuria 03/01/2013   Diabetes mellitus type 2, uncontrolled 08/16/2010   Nicotine dependence 02/11/2010   Health examination of defined subpopulation 02/08/2009   Benign essential HTN 12/28/2008   CD (Crohn's disease) ([place]) 12/28/2008   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 Authorizing Provider  predniSONE (DELTASONE) 20 MG tablet Take 2 tablets (40 mg total) by mouth daily with breakfast for 5 days. 11/07/21 11/12/21 Yes Francene Finders, PA-C  atorvastatin (LIPITOR) 80 MG tablet Take 80 mg by mouth daily. 12/28/08   [provider]  B Complex Vitamins (B COMPLEX PO) Take 1 tablet by mouth daily.     [provider]  BD PEN NEEDLE NANO U/F 32G X 4 MM MISC USE TO INJECT INSULIN TWICE DAILY 08/08/18   [provider]  CALCIUM PO Take 1 tablet by mouth daily.    [provider]  chlorhexidine (PERIDEX) 0.12 % solution SMARTSIG:1 Capful(s) By Mouth Twice Daily 07/29/21   [provider]  dapagliflozin propanediol (FARXIGA) 10 MG TABS tablet 1 tablet 11/14/20   [provider]  leuprolide, 6 Month, (LEUPROLIDE ACETATE, 6 MONTH,) 45 MG injection inject '45mg'$     [provider]  LEVEMIR FLEXTOUCH 100 UNIT/ML Pen Inject 90 Units into the skin daily. 11/06/14   [provider]  lisinopril (ZESTRIL) 20 MG tablet Take 20 mg by mouth daily. 11/09/18   [provider]  Multiple Vitamin (MULTIVITAMIN) tablet Take 1 tablet by mouth  daily. AREDS    [provider]  Multiple Vitamins-Minerals (PRESERVISION AREDS 2) CAPS Take 1 capsule by mouth daily. 04/20/18   [provider]  [facility] VERIO test strip 2 (two) times daily. test blood sugar 09/16/18   [provider]  PFIZER-BIONT COVID-19 VAC-TRIS SUSP injection  07/21/20   [provider]  VICTOZA 18 MG/3ML SOPN Inject 18 mg into the skin daily.  02/06/14   [provider]  ZETIA 10 MG tablet Take 10 mg by mouth daily. 02/14/14   [provider]    Family History Family History  Problem Relation Age of Onset   CVA Mother    Heart attack Mother     Crohn's disease Mother    Diabetes Father    Heart failure Paternal Grandmother     Social History Social History   Tobacco Use   Smoking status: Former    Packs/day: 2.00    Years: 50.00    Total pack years: 100.00    Types: Cigarettes    Quit date: 02/08/2004    Years since quitting: 17.7   Smokeless tobacco: Never  Substance Use Topics   Alcohol use: Yes    Alcohol/week: 1.0 standard drink of alcohol    Types: 1 Shots of liquor per week    Comment: daily   Drug use: No     Allergies   Bee venom and Other   Review of Systems Review of Systems  Constitutional:  Negative for chills and fever.  HENT:  Negative for facial swelling and trouble swallowing.   Eyes:  Negative for discharge and redness.  Respiratory:  Negative for shortness of breath.   Skin:  Positive for color change and rash.     Physical Exam Triage Vital Signs ED Triage Vitals  Enc Vitals Group     BP      Pulse      Resp      Temp      Temp src      SpO2      Weight      Height      Head Circumference      Peak Flow      Pain Score      Pain Loc      Pain Edu?      Excl. in [place]?    No data found.  Updated Vital Signs BP (!) 140/56   Pulse (!) 57   Temp 97.6 F (36.4 C) (Oral)   Resp 18   SpO2 97%      Physical Exam Vitals and nursing note reviewed.  Constitutional:      General: He is not in acute distress.    Appearance: Normal appearance. He is not ill-appearing.  HENT:     Head: Normocephalic and atraumatic.  Eyes:     Conjunctiva/sclera: Conjunctivae normal.  Cardiovascular:     Rate and Rhythm: Normal rate.  Pulmonary:     Effort: Pulmonary effort is normal.  Skin:    Comments: Erythema noted diffusely to left forearm and left lower leg, few papules, vesicle noted to left elbow.   Neurological:     Mental Status: He is alert.  Psychiatric:        Mood and Affect: Mood normal.        Behavior: Behavior normal.        Thought Content: Thought content  normal.      UC Treatments / Results  Labs (all labs ordered are  listed, but only abnormal results are displayed) Labs Reviewed - No data to display  EKG   Radiology No results found.  Procedures Procedures (including critical care time)  Medications Ordered in UC Medications  methylPREDNISolone acetate (DEPO-MEDROL) injection 40 mg (has no administration in time range)    Initial Impression / Assessment and Plan / UC Course  I have reviewed the triage vital signs and the nursing notes.  Pertinent labs & imaging results that were available during my care of the patient were reviewed by me and considered in my medical decision making (see chart for details).    Suspect likely contact dermatitis, possibly poison ivy, and will treat with steroid injection in office and prednisone orally to start tomorrow.  Encouraged follow-up if symptoms do not improve with treatment or worsen.  Final Clinical Impressions(s) / UC Diagnoses   Final diagnoses:  Dermatitis   Discharge Instructions   None    ED Prescriptions     Medication Sig Dispense Auth. Provider   predniSONE (DELTASONE) 20 MG tablet Take 2 tablets (40 mg total) by mouth daily with breakfast for 5 days. 10 tablet Francene Finders, PA-C      PDMP not reviewed this encounter.   Francene Finders, PA-C 11/07/21 [phone]

## 2021-11-07 NOTE — ED Triage Notes (Signed)
Pt presents with rash on L arm and L leg following being outside doing yard work. Pt reports rash is itchy and has applied benadryl cream to the area.

## 2021-12-04 DIAGNOSIS — H40023 Open angle with borderline findings, high risk, bilateral: Secondary | ICD-10-CM | POA: Diagnosis not present

## 2021-12-10 DIAGNOSIS — E1151 Type 2 diabetes mellitus with diabetic peripheral angiopathy without gangrene: Secondary | ICD-10-CM | POA: Diagnosis not present

## 2021-12-10 DIAGNOSIS — N182 Chronic kidney disease, stage 2 (mild): Secondary | ICD-10-CM | POA: Diagnosis not present

## 2021-12-10 DIAGNOSIS — E1129 Type 2 diabetes mellitus with other diabetic kidney complication: Secondary | ICD-10-CM | POA: Diagnosis not present

## 2021-12-10 DIAGNOSIS — I2510 Atherosclerotic heart disease of native coronary artery without angina pectoris: Secondary | ICD-10-CM | POA: Diagnosis not present

## 2021-12-10 DIAGNOSIS — K5090 Crohn's disease, unspecified, without complications: Secondary | ICD-10-CM | POA: Diagnosis not present

## 2021-12-10 DIAGNOSIS — Z794 Long term (current) use of insulin: Secondary | ICD-10-CM | POA: Diagnosis not present

## 2021-12-10 DIAGNOSIS — Z87891 Personal history of nicotine dependence: Secondary | ICD-10-CM | POA: Diagnosis not present

## 2021-12-10 DIAGNOSIS — R809 Proteinuria, unspecified: Secondary | ICD-10-CM | POA: Diagnosis not present

## 2021-12-10 DIAGNOSIS — I1310 Hypertensive heart and chronic kidney disease without heart failure, with stage 1 through stage 4 chronic kidney disease, or unspecified chronic kidney disease: Secondary | ICD-10-CM | POA: Diagnosis not present

## 2021-12-10 DIAGNOSIS — I739 Peripheral vascular disease, unspecified: Secondary | ICD-10-CM | POA: Diagnosis not present

## 2021-12-10 DIAGNOSIS — C61 Malignant neoplasm of prostate: Secondary | ICD-10-CM | POA: Diagnosis not present

## 2021-12-10 DIAGNOSIS — E7800 Pure hypercholesterolemia, unspecified: Secondary | ICD-10-CM | POA: Diagnosis not present

## 2022-01-02 ENCOUNTER — Encounter: Payer: Self-pay | Admitting: Podiatry

## 2022-01-02 ENCOUNTER — Ambulatory Visit: Payer: Medicare HMO | Admitting: Podiatry

## 2022-01-02 DIAGNOSIS — L600 Ingrowing nail: Secondary | ICD-10-CM | POA: Diagnosis not present

## 2022-01-02 NOTE — Progress Notes (Signed)
Subjective:   Patient ID: Ryan Saunders, male   DOB: 76 y.o.   MRN: [id number]   HPI Patient presents stating that he has had a painful ingrown toenail of the right big toe medial border that is sugar has been stable and A1c has been good and that he had circulatory studies done that showed adequate flow   ROS      Objective:  Physical Exam  Neurovascular status was checked with patient found to have adequate flow to the right digits with good digital perfusion noted toes are warm with an incurvated medial border right hallux painful when pressed     Assessment:  Chronic ingrown toenail deformity right hallux medial border painful when pressed with patient who does get regular debridements but they are not helping this particular problem      Plan:  H&P reviewed condition recommended correction explained risk of surgery with his diabetes and he wants to proceed and allowed him to read then signed consent form understanding risk.  Today infiltrated the right hallux 60 mg like Marcaine mixture sterile prep done to the right hallux and then using sterile instrumentation remove the medial border exposed matrix applied phenol 3 applications 30 seconds followed by alcohol lavage sterile dressing gave instructions on soaks and leave dressing on 24 hours but take it off earlier if any throbbing were to occur and encouraged him to call with questions concerns which may arise

## 2022-01-02 NOTE — Patient Instructions (Signed)

## 2022-01-14 DIAGNOSIS — C61 Malignant neoplasm of prostate: Secondary | ICD-10-CM | POA: Diagnosis not present

## 2022-01-21 DIAGNOSIS — C61 Malignant neoplasm of prostate: Secondary | ICD-10-CM | POA: Diagnosis not present

## 2022-01-21 DIAGNOSIS — N130 Hydronephrosis with ureteropelvic junction obstruction: Secondary | ICD-10-CM | POA: Diagnosis not present

## 2022-01-24 ENCOUNTER — Ambulatory Visit: Payer: Medicare HMO | Admitting: Podiatry

## 2022-01-24 DIAGNOSIS — E119 Type 2 diabetes mellitus without complications: Secondary | ICD-10-CM

## 2022-01-24 DIAGNOSIS — M79675 Pain in left toe(s): Secondary | ICD-10-CM | POA: Diagnosis not present

## 2022-01-24 DIAGNOSIS — Z9889 Other specified postprocedural states: Secondary | ICD-10-CM | POA: Diagnosis not present

## 2022-01-24 DIAGNOSIS — B351 Tinea unguium: Secondary | ICD-10-CM

## 2022-01-24 DIAGNOSIS — M79674 Pain in right toe(s): Secondary | ICD-10-CM

## 2022-01-24 DIAGNOSIS — L84 Corns and callosities: Secondary | ICD-10-CM

## 2022-01-24 DIAGNOSIS — Z794 Long term (current) use of insulin: Secondary | ICD-10-CM

## 2022-01-24 MED ORDER — NEOMYCIN-POLYMYXIN-HC 1 % OT SOLN: OTIC | 1 refills | Status: DC

## 2022-01-24 MED ORDER — DOXYCYCLINE HYCLATE 100 MG PO CAPS: 100.0000 mg | ORAL_CAPSULE | Freq: Two times a day (BID) | ORAL | 0 refills | Status: AC

## 2022-01-24 NOTE — Progress Notes (Unsigned)
  Subjective:  Patient ID: Ryan Saunders, male    DOB: 05/14/1945,  MRN: [id number]  Ryan Saunders presents to clinic today for: No chief complaint on file.   New problem(s): None. {jgcomplaint:23593}  PCP is Tisovec, Fransico Him, MD , and last visit was  {Time; dates multiple:15870}.  Allergies  Allergen Reactions   Bee Venom     Other reaction(s): swelling in lips and mouth Other reaction(s): Unknown   Other Other (See Comments)    Review of Systems: Negative except as noted in the HPI.  Objective: No changes noted in today's physical examination.  Ryan Saunders is a pleasant 76 y.o. male {jgbodyhabitus:24098} AAO x 3.  Vascular Examination: Capillary refill time to digits immediate b/l. Palpable DP pulse(s) right lower extremity. Palpable PT pulse(s) left lower extremity Faintly palpable DP pulse(s) LLE. Faintly palpable PT pulse(s) right lower extremity. Pedal hair sparse. No pain with calf compression b/l. No edema noted b/l LE. No ischemia or gangrene noted b/l LE.   Dermatological Examination: Pedal skin is warm and supple b/l LE. No open wounds b/l LE. No interdigital macerations noted b/l LE. Toenails 2-5 bilaterally and R hallux elongated, discolored, dystrophic, thickened, and crumbly with subungual debris and tenderness to dorsal palpation. Anonychia noted L hallux. Nailbed(s) epithelialized.  Hyperkeratotic lesion(s) bilateral great toes.  No erythema, no edema, no drainage, no fluctuance.  Musculoskeletal Examination: Muscle strength 5/5 to all lower extremity muscle groups bilaterally. No gross bony deformities bilaterally. Limited joint ROM to the 1st MPJ right foot.  Neurological Examination: Protective sensation intact 5/5 intact bilaterally with 10g monofilament b/l. Vibratory sensation intact b/l.  Assessment/Plan: No diagnosis found.  No orders of the defined types were placed in this encounter.   {Jgplan:23602::"-Patient/POA to call should there  be question/concern in the interim."}   Return in about 3 months (around 04/26/2022).  Marzetta Board, DPM

## 2022-01-27 ENCOUNTER — Encounter: Payer: Self-pay | Admitting: Podiatry

## 2022-05-12 ENCOUNTER — Ambulatory Visit: Payer: Medicare HMO | Admitting: Podiatry

## 2022-05-12 VITALS — BP 141/58 | HR 52 | Temp 97.6°F

## 2022-05-12 DIAGNOSIS — Z794 Long term (current) use of insulin: Secondary | ICD-10-CM | POA: Diagnosis not present

## 2022-05-12 DIAGNOSIS — L84 Corns and callosities: Secondary | ICD-10-CM | POA: Diagnosis not present

## 2022-05-12 DIAGNOSIS — B351 Tinea unguium: Secondary | ICD-10-CM | POA: Diagnosis not present

## 2022-05-12 DIAGNOSIS — M79674 Pain in right toe(s): Secondary | ICD-10-CM

## 2022-05-12 DIAGNOSIS — M205X1 Other deformities of toe(s) (acquired), right foot: Secondary | ICD-10-CM

## 2022-05-12 DIAGNOSIS — M79675 Pain in left toe(s): Secondary | ICD-10-CM

## 2022-05-12 DIAGNOSIS — E119 Type 2 diabetes mellitus without complications: Secondary | ICD-10-CM | POA: Diagnosis not present

## 2022-05-13 ENCOUNTER — Encounter: Payer: Self-pay | Admitting: Podiatry

## 2022-05-13 NOTE — Progress Notes (Signed)
ANNUAL DIABETIC FOOT EXAM  Subjective: Ryan Saunders presents today for annual diabetic foot examination. Patient states he attempted to trim his toenails and nicked a couple of them. He is accompanied by his wife on today's visit. Chief Complaint  Patient presents with   Follow-up    Diabetic foot care B.S am- 64 Last A1c-6.5 PCP/last visit- Richard Tisovec,MD/ last visit 12/10/21 Patient is taking his diabetic medications.   Patient confirms h/o diabetes.  Patient relates 23 year h/o diabetes.  Patient denies any h/o foot wounds.  Patient denies any numbness, tingling, burning, or pins/needle sensation in feet.  Risk factors: diabetes, HTN, CAD, hyperlipidemia, h/o tobacco use in remission.  Tisovec, Fransico Him, MD is patient's PCP.  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   Long term (current) use of insulin ([place]) 03/20/2015   Ventricular premature depolarization 03/20/2015   Malignant neoplasm of prostate ([place]) 02/08/2015   CAD (coronary artery disease) 04/21/2014   HTN (hypertension) 04/21/2014   Hyperlipidemia 04/21/2014   Angiopathy, peripheral ([place]) 03/14/2014   Benign neoplasm of colon 09/21/2013   Microalbuminuria 03/01/2013   Diabetes mellitus type 2, uncontrolled 08/16/2010   Nicotine dependence 02/11/2010   Health examination of defined subpopulation 02/08/2009   Benign essential HTN 12/28/2008   CD (Crohn's disease) ([place]) 12/28/2008   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     OZEMPIC, 1 MG/DOSE, 4 MG/3ML SOPN Inject 1 mg into the skin once a week.     atorvastatin (LIPITOR) 80 MG tablet Take 80 mg by mouth daily.     B Complex Vitamins (B COMPLEX PO) Take 1 tablet by mouth daily.      BD PEN NEEDLE NANO U/F 32G X 4 MM MISC USE TO INJECT INSULIN TWICE DAILY     CALCIUM PO Take 1 tablet by mouth daily.     chlorhexidine (PERIDEX) 0.12 % solution SMARTSIG:1 Capful(s) By Mouth Twice Daily     dapagliflozin propanediol (FARXIGA) 10 MG TABS tablet 1 tablet     leuprolide, 6 Month, (LEUPROLIDE ACETATE, 6 MONTH,) 45 MG injection inject '45mg'$      LEVEMIR FLEXTOUCH 100 UNIT/ML Pen Inject 90 Units into the skin daily.     lisinopril (ZESTRIL) 20 MG tablet Take 20 mg by mouth daily.     Multiple Vitamin (MULTIVITAMIN) tablet Take 1 tablet by mouth daily. AREDS     Multiple Vitamins-Minerals (PRESERVISION AREDS 2) CAPS Take 1 capsule by mouth daily.     NEOMYCIN-POLYMYXIN-HYDROCORTISONE (CORTISPORIN) 1 % SOLN OTIC solution Apply two drops to right great toe once daily for 30 days. 10 mL 1   ONETOUCH VERIO test strip 2 (two) times daily. test blood  sugar     PFIZER-BIONT COVID-19 VAC-TRIS SUSP injection      VICTOZA 18 MG/3ML SOPN Inject 18 mg into the skin daily.   6   ZETIA 10 MG tablet Take 10 mg by mouth daily.  3   No current facility-administered medications on file prior to visit.    Allergies  Allergen Reactions   Bee Venom     Other reaction(s): swelling in lips and mouth Other reaction(s): Unknown   Other Other (See Comments)   Social History   Occupational History   Occupation: retired  Tobacco Use   Smoking status: Former    Packs/day: 2.00    Years: 50.00    Total pack years: 100.00    Types:  Cigarettes    Quit date: 02/08/2004    Years since quitting: 18.2   Smokeless tobacco: Never  Substance and Sexual Activity   Alcohol use: Yes    Alcohol/week: 1.0 standard drink of alcohol    Types: 1 Shots of liquor per week    Comment: daily   Drug use: No   Sexual activity: Not on file   Family History  Problem Relation Age of Onset   CVA Mother    Heart attack Mother    Crohn's disease Mother    Diabetes Father    Heart failure Paternal Grandmother    Immunization History  Administered Date(s) Administered   PFIZER(Purple Top)SARS-COV-2 Vaccination 05/27/2019, 06/19/2019     Review of Systems: Negative except as noted in the HPI.   Objective: Vitals:   05/12/22 1437  BP: (!) 141/58  Pulse: (!) 52  Temp: 97.6 F (36.4 C)  SpO2: 95%    Ryan Saunders is a pleasant 77 y.o. male in NAD. AAO X 3.  Vascular Examination: Capillary refill time to digits immediate b/l. Palpable DP pulse(s) right foot Palpable PT pulse(s) left foot. Faintly palpable DP pulse(s) left foot. Faintly palpable PT pulse(s) right foot. Pedal hair absent. No pain with calf compression b/l. No edema noted b/l LE. No ischemia or gangrene noted b/l LE. No cyanosis or clubbing noted b/l LE.  Dermatological Examination: Evidence of attempted trimming with healing laceration distal tip of right 5th toe and left 3rd digit; no erythema, no edema, no drainage, no fluctuance. Pedal integument with normal turgor, texture and tone BLE. No open wounds b/l LE. No interdigital macerations noted b/l LE. Toenails 2-5 bilaterally and R hallux elongated, discolored, dystrophic, thickened, and crumbly with subungual debris and tenderness to dorsal palpation. 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 Hyperkeratotic lesion(s) medial IPJ of left great toe and medial IPJ of right great toe.  No erythema, no edema, no drainage, no fluctuance.  Neurological Examination: Protective sensation intact 5/5 intact  bilaterally with 10g monofilament b/l. Vibratory sensation intact b/l. Proprioception intact bilaterally.  Musculoskeletal Examination: Muscle strength 5/5 to all lower extremity muscle groups bilaterally. No pain, crepitus or joint limitation noted with ROM bilateral LE. Limited joint ROM to the 1st MPJ right foot.  Footwear Assessment: Does the patient wear appropriate shoes? Yes. Does the patient need inserts/orthotics? No.  No results found for: "HGBA1C"  No results found. ADA Risk Categorization: Low Risk :  Patient has all of the following: Intact protective sensation No prior foot ulcer  No severe deformity Pedal pulses present  High Risk  Patient has one or more of the following: Loss of protective sensation Absent pedal pulses Severe Foot deformity History of foot ulcer  Assessment: 1. Pain due to  onychomycosis of toenails of both feet   2. Callus   3. Hallux limitus of right foot   4. Type 2 diabetes mellitus without complication, with long-term current use of insulin ([place])   5. Encounter for diabetic foot exam [facility])    Plan: -Patient's family member present. All questions/concerns addressed on today's visit. -Patient educated on attempted trimming of nails. He is to apply Neosporin to left 3rd and right 5th digit once daily until healed. -Diabetic foot examination performed today. -Patient to continue soft, supportive shoe gear daily. -Toenails 2-5 bilaterally and right great toe debrided in length and girth without iatrogenic bleeding with sterile nail nipper and dremel.  -Callus(es) bilateral great toes pared utilizing sterile scalpel blade without complication or incident. Total number debrided =2. -Patient/POA to call should there be question/concern in the interim. Return in about 3 months (around 08/11/2022).  Marzetta Board, DPM

## 2022-05-26 DIAGNOSIS — R7989 Other specified abnormal findings of blood chemistry: Secondary | ICD-10-CM | POA: Diagnosis not present

## 2022-05-26 DIAGNOSIS — Z125 Encounter for screening for malignant neoplasm of prostate: Secondary | ICD-10-CM | POA: Diagnosis not present

## 2022-05-26 DIAGNOSIS — E1129 Type 2 diabetes mellitus with other diabetic kidney complication: Secondary | ICD-10-CM | POA: Diagnosis not present

## 2022-05-26 DIAGNOSIS — E7800 Pure hypercholesterolemia, unspecified: Secondary | ICD-10-CM | POA: Diagnosis not present

## 2022-05-26 DIAGNOSIS — I2510 Atherosclerotic heart disease of native coronary artery without angina pectoris: Secondary | ICD-10-CM | POA: Diagnosis not present

## 2022-06-02 DIAGNOSIS — N182 Chronic kidney disease, stage 2 (mild): Secondary | ICD-10-CM | POA: Diagnosis not present

## 2022-06-02 DIAGNOSIS — I1310 Hypertensive heart and chronic kidney disease without heart failure, with stage 1 through stage 4 chronic kidney disease, or unspecified chronic kidney disease: Secondary | ICD-10-CM | POA: Diagnosis not present

## 2022-06-02 DIAGNOSIS — Z0000 Encounter for general adult medical examination without abnormal findings: Secondary | ICD-10-CM | POA: Diagnosis not present

## 2022-06-02 DIAGNOSIS — I739 Peripheral vascular disease, unspecified: Secondary | ICD-10-CM | POA: Diagnosis not present

## 2022-06-02 DIAGNOSIS — Z1331 Encounter for screening for depression: Secondary | ICD-10-CM | POA: Diagnosis not present

## 2022-06-02 DIAGNOSIS — I493 Ventricular premature depolarization: Secondary | ICD-10-CM | POA: Diagnosis not present

## 2022-06-02 DIAGNOSIS — C61 Malignant neoplasm of prostate: Secondary | ICD-10-CM | POA: Diagnosis not present

## 2022-06-02 DIAGNOSIS — E1129 Type 2 diabetes mellitus with other diabetic kidney complication: Secondary | ICD-10-CM | POA: Diagnosis not present

## 2022-06-02 DIAGNOSIS — I2510 Atherosclerotic heart disease of native coronary artery without angina pectoris: Secondary | ICD-10-CM | POA: Diagnosis not present

## 2022-06-02 DIAGNOSIS — R82998 Other abnormal findings in urine: Secondary | ICD-10-CM | POA: Diagnosis not present

## 2022-06-02 DIAGNOSIS — E1151 Type 2 diabetes mellitus with diabetic peripheral angiopathy without gangrene: Secondary | ICD-10-CM | POA: Diagnosis not present

## 2022-06-02 DIAGNOSIS — K5090 Crohn's disease, unspecified, without complications: Secondary | ICD-10-CM | POA: Diagnosis not present

## 2022-06-02 DIAGNOSIS — R809 Proteinuria, unspecified: Secondary | ICD-10-CM | POA: Diagnosis not present

## 2022-06-02 DIAGNOSIS — Z1339 Encounter for screening examination for other mental health and behavioral disorders: Secondary | ICD-10-CM | POA: Diagnosis not present

## 2022-06-02 DIAGNOSIS — Z794 Long term (current) use of insulin: Secondary | ICD-10-CM | POA: Diagnosis not present

## 2022-07-17 DIAGNOSIS — C61 Malignant neoplasm of prostate: Secondary | ICD-10-CM | POA: Diagnosis not present

## 2022-07-24 DIAGNOSIS — C61 Malignant neoplasm of prostate: Secondary | ICD-10-CM | POA: Diagnosis not present

## 2022-07-24 DIAGNOSIS — M8580 Other specified disorders of bone density and structure, unspecified site: Secondary | ICD-10-CM | POA: Diagnosis not present

## 2022-07-24 DIAGNOSIS — N130 Hydronephrosis with ureteropelvic junction obstruction: Secondary | ICD-10-CM | POA: Diagnosis not present

## 2022-08-19 ENCOUNTER — Ambulatory Visit: Payer: Medicare HMO | Admitting: Podiatry

## 2022-08-19 ENCOUNTER — Encounter: Payer: Self-pay | Admitting: Podiatry

## 2022-08-19 DIAGNOSIS — B351 Tinea unguium: Secondary | ICD-10-CM | POA: Diagnosis not present

## 2022-08-19 DIAGNOSIS — M79675 Pain in left toe(s): Secondary | ICD-10-CM | POA: Diagnosis not present

## 2022-08-19 DIAGNOSIS — Z794 Long term (current) use of insulin: Secondary | ICD-10-CM

## 2022-08-19 DIAGNOSIS — L84 Corns and callosities: Secondary | ICD-10-CM

## 2022-08-19 DIAGNOSIS — M79674 Pain in right toe(s): Secondary | ICD-10-CM | POA: Diagnosis not present

## 2022-08-19 DIAGNOSIS — E119 Type 2 diabetes mellitus without complications: Secondary | ICD-10-CM

## 2022-08-19 NOTE — Progress Notes (Signed)
  Subjective:  Patient ID: Ryan Saunders, male    DOB: [birth year],  MRN: [id number]  Ryan Saunders presents to clinic today for preventative diabetic foot care and callus(es) both feet and painful thick toenails that are difficult to trim. Painful toenails interfere with ambulation. Aggravating factors include wearing enclosed shoe gear. Pain is relieved with periodic professional debridement. Painful calluses are aggravated when weightbearing with and without shoegear. Pain is relieved with periodic professional debridement.  Chief Complaint  Patient presents with   Diabetes    [facility] BS - 82 A1C - 5.1 LVPCP - 05/2022   New problem(s): None.   PCP is Tisovec, Adelfa Koh, MD.  Allergies  Allergen Reactions   Bee Venom     Other reaction(s): swelling in lips and mouth Other reaction(s): Unknown   Other Other (See Comments)    Review of Systems: Negative except as noted in the HPI.  Objective: No changes noted in today's physical examination. There were no vitals filed for this visit. Ryan Saunders is a pleasant 77 y.o. male WD, WN in NAD. AAO x 3.  Vascular Examination: Capillary refill time to digits immediate b/l. Palpable DP pulse(s) right foot. Palpable PT pulse(s) left foot. Faintly palpable DP pulse(s) left foot. Faintly palpable PT pulse(s) right foot. Pedal hair absent. No pain with calf compression b/l. No edema noted b/l LE. No cyanosis or clubbing noted b/l LE.  Dermatological Examination: Pedal integument with normal turgor, texture and tone BLE. No open wounds b/l LE. No interdigital macerations noted b/l LE.   Toenails 2-5 bilaterally and R hallux elongated, discolored, dystrophic, thickened, and crumbly with subungual debris and tenderness to dorsal palpation.   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   Hyperkeratotic lesion(s) medial IPJ of left great toe and medial IPJ of right great toe.  No erythema, no edema, no drainage, no  fluctuance.  Neurological Examination: Protective sensation intact 5/5 intact bilaterally with 10g monofilament b/l. Vibratory sensation intact b/l. Proprioception intact bilaterally.  Musculoskeletal Examination: Muscle strength 5/5 to all lower extremity muscle groups bilaterally. No pain, crepitus or joint limitation noted with ROM bilateral LE. Limited joint ROM to the 1st MPJ right foot.  Assessment/Plan: 1. Pain due to onychomycosis of toenails of both feet   2. Type 2 diabetes mellitus without complication, with long-term current use of insulin (HCC)    -Patient was evaluated and treated. All patient's and/or POA's questions/concerns answered on today's visit. -Continue foot and shoe inspections daily. Monitor blood glucose per PCP/Endocrinologist's recommendations. -Toenails were debrided in length and girth 2-5 bilaterally and right great toe with sterile nail nippers and dremel without iatrogenic bleeding.  -Callus(es) bilateral great toes pared utilizing rotary bur without complication or incident. Total number pared =2. -Patient/POA to call should there be question/concern in the interim.   Return in about 3 months (around 11/19/2022).  Freddie Breech, DPM

## 2022-10-30 DIAGNOSIS — C61 Malignant neoplasm of prostate: Secondary | ICD-10-CM | POA: Diagnosis not present

## 2022-11-20 ENCOUNTER — Ambulatory Visit: Payer: Medicare HMO | Admitting: Podiatry

## 2022-11-25 DIAGNOSIS — M8580 Other specified disorders of bone density and structure, unspecified site: Secondary | ICD-10-CM | POA: Diagnosis not present

## 2022-11-25 DIAGNOSIS — N130 Hydronephrosis with ureteropelvic junction obstruction: Secondary | ICD-10-CM | POA: Diagnosis not present

## 2022-11-25 DIAGNOSIS — C61 Malignant neoplasm of prostate: Secondary | ICD-10-CM | POA: Diagnosis not present

## 2022-12-01 ENCOUNTER — Ambulatory Visit (INDEPENDENT_AMBULATORY_CARE_PROVIDER_SITE_OTHER): Payer: Medicare HMO | Admitting: Podiatry

## 2022-12-01 DIAGNOSIS — B351 Tinea unguium: Secondary | ICD-10-CM | POA: Diagnosis not present

## 2022-12-01 DIAGNOSIS — E1151 Type 2 diabetes mellitus with diabetic peripheral angiopathy without gangrene: Secondary | ICD-10-CM

## 2022-12-01 DIAGNOSIS — Z794 Long term (current) use of insulin: Secondary | ICD-10-CM | POA: Diagnosis not present

## 2022-12-01 DIAGNOSIS — E119 Type 2 diabetes mellitus without complications: Secondary | ICD-10-CM | POA: Diagnosis not present

## 2022-12-01 DIAGNOSIS — M79675 Pain in left toe(s): Secondary | ICD-10-CM

## 2022-12-01 DIAGNOSIS — M79674 Pain in right toe(s): Secondary | ICD-10-CM | POA: Diagnosis not present

## 2022-12-01 NOTE — Progress Notes (Signed)
  Subjective:  Patient ID: Ryan Saunders, male    DOB: 07-21-1945,  MRN: [id number]  Ryan Saunders presents to clinic today for: preventative diabetic foot care and painful elongated mycotic toenails 1-5 bilaterally which are tender when wearing enclosed shoe gear. Pain is relieved with periodic professional debridement.  Chief Complaint  Patient presents with   Nail Problem    RFC,Referring Provider Tisovec, Adelfa Koh, MD,lov:08/24       PCP is Tisovec, Adelfa Koh, MD.  Allergies  Allergen Reactions   Bee Venom     Other reaction(s): swelling in lips and mouth Other reaction(s): Unknown   Other Other (See Comments)    Review of Systems: Negative except as noted in the HPI.  Objective: No changes noted in today's physical examination. There were no vitals filed for this visit.  Ryan Saunders is a pleasant 77 y.o. male in NAD. AAO x 3.  Vascular Examination: Capillary refill time to digits immediate b/l. Palpable DP pulse(s) right foot. Palpable PT pulse(s) left foot. Faintly palpable DP pulse(s) left foot. Faintly palpable PT pulse(s) right foot. Pedal hair absent. No pain with calf compression b/l. No edema noted b/l LE. No cyanosis or clubbing noted b/l LE.  Dermatological Examination: Pedal integument with normal turgor, texture and tone BLE. No open wounds b/l LE. No interdigital macerations noted b/l LE.   Toenails 2-5 bilaterally and R hallux elongated, discolored, dystrophic, thickened, and crumbly with subungual debris and tenderness to dorsal palpation.   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   Minimal hyperkeratotic lesion(s) medial IPJ of left great toe and medial IPJ of right great toe.  No erythema, no edema, no drainage, no fluctuance.  Neurological Examination: Protective sensation intact 5/5 intact bilaterally with 10g monofilament b/l. Vibratory sensation intact b/l. Proprioception intact bilaterally.  Musculoskeletal  Examination: Muscle strength 5/5 to all lower extremity muscle groups bilaterally. No pain, crepitus or joint limitation noted with ROM bilateral LE. Limited joint ROM to the 1st MPJ right foot.  Assessment/Plan: 1. Pain due to onychomycosis of toenails of both feet   2. Type II diabetes mellitus with peripheral circulatory disorder [facility])     -Patient's family member present. All questions/concerns addressed on today's visit. -Continue foot and shoe inspections daily. Monitor blood glucose per PCP/Endocrinologist's recommendations. -Patient to continue soft, supportive shoe gear daily. -Mycotic toenails 1-5 bilaterally were debrided in length and girth with sterile nail nippers and dremel without incident. -Patient/POA to call should there be question/concern in the interim.   Return in about 3 months (around 03/03/2023).  Freddie Breech, DPM

## 2022-12-02 DIAGNOSIS — C61 Malignant neoplasm of prostate: Secondary | ICD-10-CM | POA: Diagnosis not present

## 2022-12-03 DIAGNOSIS — E113293 Type 2 diabetes mellitus with mild nonproliferative diabetic retinopathy without macular edema, bilateral: Secondary | ICD-10-CM | POA: Diagnosis not present

## 2022-12-03 DIAGNOSIS — H524 Presbyopia: Secondary | ICD-10-CM | POA: Diagnosis not present

## 2022-12-03 DIAGNOSIS — H52203 Unspecified astigmatism, bilateral: Secondary | ICD-10-CM | POA: Diagnosis not present

## 2022-12-03 DIAGNOSIS — H40013 Open angle with borderline findings, low risk, bilateral: Secondary | ICD-10-CM | POA: Diagnosis not present

## 2022-12-03 DIAGNOSIS — H26493 Other secondary cataract, bilateral: Secondary | ICD-10-CM | POA: Diagnosis not present

## 2022-12-03 DIAGNOSIS — H353122 Nonexudative age-related macular degeneration, left eye, intermediate dry stage: Secondary | ICD-10-CM | POA: Diagnosis not present

## 2022-12-06 ENCOUNTER — Encounter: Payer: Self-pay | Admitting: Podiatry

## 2023-03-05 ENCOUNTER — Encounter: Payer: Self-pay | Admitting: Podiatry

## 2023-03-05 ENCOUNTER — Ambulatory Visit: Payer: Medicare HMO | Admitting: Podiatry

## 2023-03-05 DIAGNOSIS — M79675 Pain in left toe(s): Secondary | ICD-10-CM | POA: Diagnosis not present

## 2023-03-05 DIAGNOSIS — M79674 Pain in right toe(s): Secondary | ICD-10-CM | POA: Diagnosis not present

## 2023-03-05 DIAGNOSIS — E1151 Type 2 diabetes mellitus with diabetic peripheral angiopathy without gangrene: Secondary | ICD-10-CM

## 2023-03-05 DIAGNOSIS — B351 Tinea unguium: Secondary | ICD-10-CM | POA: Diagnosis not present

## 2023-03-11 NOTE — Progress Notes (Signed)
  Subjective:  Patient ID: Ryan Saunders, male    DOB: August 09, 1945,  MRN: [id number]  77 y.o. male presents preventative diabetic foot care and painful thick toenails that are difficult to trim. Pain interferes with ambulation. Aggravating factors include wearing enclosed shoe gear. Pain is relieved with periodic professional debridement.  He is accompanied by his wife on today's visit.  New problem(s): None   PCP is Tisovec, Adelfa Koh, MD , and last visit was November 27, 2022.  Allergies  Allergen Reactions   Bee Venom     Other reaction(s): swelling in lips and mouth Other reaction(s): Unknown   Other Other (See Comments)   Review of Systems: Negative except as noted in the HPI.   Objective:  Ryan Saunders is a pleasant 77 y.o. male in NAD. AAO x 3. AAO x 3.  Vascular Examination: DP pulse palpable right foot and faintly palpable left foot. Palpable PT pulses b/l. CFT immediate b/l. Pedal pulses decreased.. No edema. No pain with calf compression b/l. Skin temperature gradient WNL b/l. No varicosities noted. No cyanosis or clubbing noted.  Neurological Examination: Sensation grossly intact b/l with 10 gram monofilament. Vibratory sensation intact b/l.  Dermatological Examination: Pedal integument with normal turgor, texture and tone BLE. No open wounds b/l LE. No interdigital macerations noted b/l LE.   Toenails 2-5 bilaterally and R hallux elongated, discolored, dystrophic, thickened, and crumbly with subungual debris and tenderness to dorsal palpation.   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   Incurvated nailplate medial border of L 2nd toe with tenderness to palpation. No erythema, no edema, no drainage noted. No fluctuance.   Musculoskeletal Examination: Muscle strength 5/5 to b/l LE. Limited joint ROM to the 1st MPJ right foot. Patient ambulates independently without assistive aids.   Radiographs: None  Last A1c:       No data to display          Assessment:   1. Pain due to onychomycosis of toenails of both feet   2. Type II diabetes mellitus with peripheral circulatory disorder [facility])    Plan:  -Consent given for treatment as described below: -Examined patient. -Continue foot and shoe inspections daily. Monitor blood glucose per PCP/Endocrinologist's recommendations. -Continue supportive shoe gear daily. -Toenails were debrided in length and girth 2-5 bilaterally and right great toe with sterile nail nippers and dremel without iatrogenic bleeding.  -No invasive procedure(s) performed. Offending nail border debrided and curretaged L 2nd toe utilizing sterile nail nipper and currette. Border cleansed with alcohol and triple antibiotic ointment applied. No further treatment required by patient/caregiver. Call office if there are any concerns. -Patient/POA to call should there be question/concern in the interim.  Return in about 3 months (around 06/05/2023).  Ryan Saunders, DPM      [place] LOCATION: [address] [place] [address]                   Office [phone]   [facility] LOCATION: [address] [place], [place] [address] Office [phone]

## 2023-05-29 DIAGNOSIS — C61 Malignant neoplasm of prostate: Secondary | ICD-10-CM | POA: Diagnosis not present

## 2023-05-29 DIAGNOSIS — E7800 Pure hypercholesterolemia, unspecified: Secondary | ICD-10-CM | POA: Diagnosis not present

## 2023-05-29 DIAGNOSIS — I2510 Atherosclerotic heart disease of native coronary artery without angina pectoris: Secondary | ICD-10-CM | POA: Diagnosis not present

## 2023-05-29 DIAGNOSIS — N182 Chronic kidney disease, stage 2 (mild): Secondary | ICD-10-CM | POA: Diagnosis not present

## 2023-05-29 DIAGNOSIS — Z1212 Encounter for screening for malignant neoplasm of rectum: Secondary | ICD-10-CM | POA: Diagnosis not present

## 2023-05-29 DIAGNOSIS — E1129 Type 2 diabetes mellitus with other diabetic kidney complication: Secondary | ICD-10-CM | POA: Diagnosis not present

## 2023-05-29 DIAGNOSIS — I129 Hypertensive chronic kidney disease with stage 1 through stage 4 chronic kidney disease, or unspecified chronic kidney disease: Secondary | ICD-10-CM | POA: Diagnosis not present

## 2023-06-05 ENCOUNTER — Encounter: Payer: Self-pay | Admitting: Podiatry

## 2023-06-05 ENCOUNTER — Ambulatory Visit: Payer: Medicare HMO | Admitting: Podiatry

## 2023-06-05 VITALS — Ht 71.5 in | Wt 192.6 lb

## 2023-06-05 DIAGNOSIS — I129 Hypertensive chronic kidney disease with stage 1 through stage 4 chronic kidney disease, or unspecified chronic kidney disease: Secondary | ICD-10-CM | POA: Diagnosis not present

## 2023-06-05 DIAGNOSIS — B351 Tinea unguium: Secondary | ICD-10-CM

## 2023-06-05 DIAGNOSIS — N182 Chronic kidney disease, stage 2 (mild): Secondary | ICD-10-CM | POA: Diagnosis not present

## 2023-06-05 DIAGNOSIS — M79675 Pain in left toe(s): Secondary | ICD-10-CM | POA: Diagnosis not present

## 2023-06-05 DIAGNOSIS — I739 Peripheral vascular disease, unspecified: Secondary | ICD-10-CM | POA: Diagnosis not present

## 2023-06-05 DIAGNOSIS — K5090 Crohn's disease, unspecified, without complications: Secondary | ICD-10-CM | POA: Diagnosis not present

## 2023-06-05 DIAGNOSIS — Z794 Long term (current) use of insulin: Secondary | ICD-10-CM | POA: Diagnosis not present

## 2023-06-05 DIAGNOSIS — M79674 Pain in right toe(s): Secondary | ICD-10-CM

## 2023-06-05 DIAGNOSIS — E1151 Type 2 diabetes mellitus with diabetic peripheral angiopathy without gangrene: Secondary | ICD-10-CM

## 2023-06-05 DIAGNOSIS — Z0000 Encounter for general adult medical examination without abnormal findings: Secondary | ICD-10-CM | POA: Diagnosis not present

## 2023-06-05 DIAGNOSIS — E1129 Type 2 diabetes mellitus with other diabetic kidney complication: Secondary | ICD-10-CM | POA: Diagnosis not present

## 2023-06-05 DIAGNOSIS — Z1331 Encounter for screening for depression: Secondary | ICD-10-CM | POA: Diagnosis not present

## 2023-06-05 DIAGNOSIS — E119 Type 2 diabetes mellitus without complications: Secondary | ICD-10-CM | POA: Diagnosis not present

## 2023-06-05 DIAGNOSIS — Z1339 Encounter for screening examination for other mental health and behavioral disorders: Secondary | ICD-10-CM | POA: Diagnosis not present

## 2023-06-05 DIAGNOSIS — M2012 Hallux valgus (acquired), left foot: Secondary | ICD-10-CM

## 2023-06-05 DIAGNOSIS — R82998 Other abnormal findings in urine: Secondary | ICD-10-CM | POA: Diagnosis not present

## 2023-06-05 DIAGNOSIS — M2011 Hallux valgus (acquired), right foot: Secondary | ICD-10-CM | POA: Diagnosis not present

## 2023-06-05 DIAGNOSIS — C61 Malignant neoplasm of prostate: Secondary | ICD-10-CM | POA: Diagnosis not present

## 2023-06-05 DIAGNOSIS — I2510 Atherosclerotic heart disease of native coronary artery without angina pectoris: Secondary | ICD-10-CM | POA: Diagnosis not present

## 2023-06-05 NOTE — Progress Notes (Signed)
 ANNUAL DIABETIC FOOT EXAM  Subjective: Ryan Saunders presents today for annual diabetic foot exam. He is accompanied by his wife on today's visit. Chief Complaint  Patient presents with   Nail Problem    Pt is here for [facility] last A1C was 5.8 PCP is Dr Wylene Simmer and LOV was in August.    Patient confirms h/o diabetes.  Patient denies any h/o foot wounds.  Tisovec, Adelfa Koh, MD is patient's PCP.  Past Medical History:  Diagnosis Date   Cancer [facility])    PROSTATE CANCER   Coronary artery disease    Crohn's disease (HCC)    Hyperlipidemia    Hypertension    PVC (premature ventricular contraction)    PVD (peripheral vascular disease) (HCC)    Patient Active Problem List   Diagnosis Date Noted   Type 2 diabetes mellitus without complication (HCC) 09/27/2018   Hypertensive heart and renal disease 09/24/2015   Long term (current) use of insulin (HCC) 03/20/2015   Ventricular premature depolarization 03/20/2015   Malignant neoplasm of prostate (HCC) 02/08/2015   CAD (coronary artery disease) 04/21/2014   HTN (hypertension) 04/21/2014   Hyperlipidemia 04/21/2014   Angiopathy, peripheral (HCC) 03/14/2014   Benign neoplasm of colon 09/21/2013   Microalbuminuria 03/01/2013   Diabetes mellitus type 2, uncontrolled 08/16/2010   Nicotine dependence 02/11/2010   Health examination of defined subpopulation 02/08/2009   Benign essential HTN 12/28/2008   CD (Crohn's disease) (HCC) 12/28/2008   Diabetic kidney (HCC) 12/28/2008   Pure hypercholesterolemia 12/28/2008   Past Surgical History:  Procedure Laterality Date   ANAL FISTULA REPAIR     CRYOTHERAPY  2003   every 7 years   INSERTION PROSTATE RADIATION SEED  2003   VASECTOMY  1972   Current Outpatient Medications on File Prior to Visit  Medication Sig Dispense Refill   atorvastatin (LIPITOR) 80 MG tablet Take 80 mg by mouth daily.     B Complex Vitamins (B COMPLEX PO) Take 1 tablet by mouth daily.      BD PEN NEEDLE NANO  U/F 32G X 4 MM MISC USE TO INJECT INSULIN TWICE DAILY     CALCIUM PO Take 1 tablet by mouth daily.     chlorhexidine (PERIDEX) 0.12 % solution SMARTSIG:1 Capful(s) By Mouth Twice Daily     dapagliflozin propanediol (FARXIGA) 10 MG TABS tablet 1 tablet     LANTUS SOLOSTAR 100 UNIT/ML Solostar Pen Inject into the skin.     leuprolide, 6 Month, (LEUPROLIDE ACETATE, 6 MONTH,) 45 MG injection inject 45mg      LEVEMIR FLEXTOUCH 100 UNIT/ML Pen Inject 90 Units into the skin daily.     lisinopril (ZESTRIL) 20 MG tablet Take 20 mg by mouth daily.     Multiple Vitamin (MULTIVITAMIN) tablet Take 1 tablet by mouth daily. AREDS     Multiple Vitamins-Minerals (PRESERVISION AREDS 2) CAPS Take 1 capsule by mouth daily.     NEOMYCIN-POLYMYXIN-HYDROCORTISONE (CORTISPORIN) 1 % SOLN OTIC solution Apply two drops to right great toe once daily for 30 days. 10 mL 1   ONETOUCH VERIO test strip 2 (two) times daily. test blood sugar     OZEMPIC, 1 MG/DOSE, 4 MG/3ML SOPN Inject 1 mg into the skin once a week.     PFIZER-BIONT COVID-19 VAC-TRIS SUSP injection      VICTOZA 18 MG/3ML SOPN Inject 18 mg into the skin daily.  6   ZETIA 10 MG tablet Take 10 mg by mouth daily.  3   No  current facility-administered medications on file prior to visit.    Allergies  Allergen Reactions   Bee Venom     Other reaction(s): swelling in lips and mouth Other reaction(s): Unknown   Other Other (See Comments)   Social History   Occupational History   Occupation: retired  Tobacco Use   Smoking status: Former    Current packs/day: 0.00    Average packs/day: 2.0 packs/day for 50.0 years (100.0 ttl pk-yrs)    Types: Cigarettes    Start date: 02/07/1954    Quit date: 02/08/2004    Years since quitting: 19.3   Smokeless tobacco: Never  Substance and Sexual Activity   Alcohol use: Yes    Alcohol/week: 1.0 standard drink of alcohol    Types: 1 Shots of liquor per week    Comment: daily   Drug use: No   Sexual activity: Not on  file   Family History  Problem Relation Age of Onset   CVA Mother    Heart attack Mother    Crohn's disease Mother    Diabetes Father    Heart failure Paternal Grandmother    Immunization History  Administered Date(s) Administered   PFIZER(Purple Top)SARS-COV-2 Vaccination 05/27/2019, 06/19/2019     Review of Systems: Negative except as noted in the HPI.   Objective: There were no vitals filed for this visit.  Ryan Saunders is a pleasant 78 y.o. male in NAD. AAO X 3.  Title   Diabetic Foot Exam - detailed Date & Time: 06/05/2023  8:15 AM Diabetic Foot exam was performed with the following findings: Yes  Visual Foot Exam completed.: Yes  Is there a history of foot ulcer?: No Is there a foot ulcer now?: No Is there swelling?: No Is there elevated skin temperature?: No Is there abnormal foot shape?: No Is there a claw toe deformity?: No Are the toenails long?: Yes Are the toenails thick?: Yes Are the toenails ingrown?: Yes Is the skin thin, fragile, shiny and hairless?": No Normal Range of Motion?: Yes Is there foot or ankle muscle weakness?: No Do you have pain in calf while walking?: No Are the shoes appropriate in style and fit?: Yes Can the patient see the bottom of their feet?: Yes Pulse Foot Exam completed.: Yes   Right Posterior Tibialis: Present Left posterior Tibialis: Diminished   Right Dorsalis Pedis: Present Left Dorsalis Pedis: Diminished     Sensory Foot Exam Completed.: Yes Semmes-Weinstein Monofilament Test "+" means "has sensation" and "-" means "no sensation"  R Foot Test Control: Pos L Foot Test Control: Pos   R Site 1-Great Toe: Pos L Site 1-Great Toe: Pos   R Site 4: Pos L Site 4: Pos   R site 5: Pos L Site 5: Pos  R Site 6: Pos L Site 6: Pos     Image components are not supported.   Image components are not supported. Image components are not supported.  [place] Right vibratory: present Left vibratory: present  Comments HAV  with bunion b/l.    High Risk  Patient has one or more of the following: Loss of protective sensation Absent pedal pulses Severe Foot deformity History of foot ulcer  Assessment: 1. Pain due to onychomycosis of toenails of both feet   2. Hallux valgus, acquired, bilateral   3. Type II diabetes mellitus with peripheral circulatory disorder (HCC)   4. Encounter for diabetic foot exam (HCC)     Plan: Diabetic foot examination performed today. All patient's and/or  POA's questions/concerns addressed on today's visit. Toenails 1-5 debrided in length and girth without incident. Continue foot and shoe inspections daily. Monitor blood glucose per PCP/Endocrinologist's recommendations. Continue soft, supportive shoe gear daily. Report any pedal injuries to medical professional. Call office if there are any questions/concerns. -Patient/POA to call should there be question/concern in the interim. Return in about 3 months (around 09/02/2023).  Freddie Breech, DPM      [place] LOCATION: [address] [place] [address]                   Office [phone]   [facility] LOCATION: [address] [place], [place] [address] Office [phone]

## 2023-06-09 DIAGNOSIS — H40013 Open angle with borderline findings, low risk, bilateral: Secondary | ICD-10-CM | POA: Diagnosis not present

## 2023-06-09 DIAGNOSIS — E113293 Type 2 diabetes mellitus with mild nonproliferative diabetic retinopathy without macular edema, bilateral: Secondary | ICD-10-CM | POA: Diagnosis not present

## 2023-06-30 DIAGNOSIS — R195 Other fecal abnormalities: Secondary | ICD-10-CM | POA: Diagnosis not present

## 2023-06-30 DIAGNOSIS — Z8546 Personal history of malignant neoplasm of prostate: Secondary | ICD-10-CM | POA: Diagnosis not present

## 2023-06-30 DIAGNOSIS — K50813 Crohn's disease of both small and large intestine with fistula: Secondary | ICD-10-CM | POA: Diagnosis not present

## 2023-07-02 DIAGNOSIS — R3915 Urgency of urination: Secondary | ICD-10-CM | POA: Diagnosis not present

## 2023-07-02 DIAGNOSIS — N130 Hydronephrosis with ureteropelvic junction obstruction: Secondary | ICD-10-CM | POA: Diagnosis not present

## 2023-07-02 DIAGNOSIS — M8580 Other specified disorders of bone density and structure, unspecified site: Secondary | ICD-10-CM | POA: Diagnosis not present

## 2023-07-02 DIAGNOSIS — C61 Malignant neoplasm of prostate: Secondary | ICD-10-CM | POA: Diagnosis not present

## 2023-09-04 ENCOUNTER — Ambulatory Visit: Payer: Medicare HMO | Admitting: Podiatry

## 2023-09-04 ENCOUNTER — Encounter (HOSPITAL_COMMUNITY): Payer: Self-pay | Admitting: Urology

## 2023-09-04 DIAGNOSIS — B351 Tinea unguium: Secondary | ICD-10-CM

## 2023-09-04 DIAGNOSIS — M79675 Pain in left toe(s): Secondary | ICD-10-CM

## 2023-09-04 DIAGNOSIS — E1151 Type 2 diabetes mellitus with diabetic peripheral angiopathy without gangrene: Secondary | ICD-10-CM

## 2023-09-04 DIAGNOSIS — M79674 Pain in right toe(s): Secondary | ICD-10-CM | POA: Diagnosis not present

## 2023-09-04 NOTE — Progress Notes (Unsigned)
  Subjective:  Patient ID: Ryan Saunders, male    DOB: 08-14-1945,  MRN: [id number]  Ryan Saunders presents to clinic today for at risk foot care. Pt has h/o NIDDM with PAD and painful, elongated thickened toenails x 10 which are symptomatic when wearing enclosed shoe gear. This interferes with his/her daily activities. No chief complaint on file.  New problem(s): None.   PCP is Tisovec, Kristina Pfeiffer, MD.  Allergies  Allergen Reactions   Bee Venom     Other reaction(s): swelling in lips and mouth Other reaction(s): Unknown   Other Other (See Comments)    Review of Systems: Negative except as noted in the HPI.  Objective: No changes noted in today's physical examination. There were no vitals filed for this visit. Ryan Saunders is a pleasant 78 y.o. male in NAD. AAO x 3.  Vascular Examination: DP pulse palpable right foot and faintly palpable left foot. Palpable PT pulses b/l. CFT immediate b/l. Pedal pulses decreased.. No edema. No pain with calf compression b/l. Skin temperature gradient WNL b/l. No varicosities noted. No cyanosis or clubbing noted.  Neurological Examination: Sensation grossly intact b/l with 10 gram monofilament. Vibratory sensation intact b/l.  Dermatological Examination: Pedal integument with normal turgor, texture and tone BLE. No open wounds b/l LE. No interdigital macerations noted b/l LE.   Toenails 2-5 bilaterally and R hallux elongated, discolored, dystrophic, thickened, and crumbly with subungual debris and tenderness to dorsal palpation.   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   Incurvated nailplate medial border of L 2nd toe with tenderness to palpation. No erythema, no edema, no drainage noted. No fluctuance.   Musculoskeletal Examination: Muscle strength 5/5 to b/l LE. Limited joint ROM to the 1st MPJ right foot. Patient ambulates independently without assistive aids.   Radiographs: None  Assessment/Plan: 1. Pain due to  onychomycosis of toenails of both feet   2. Type II diabetes mellitus with peripheral circulatory disorder (HCC)     No orders of the defined types were placed in this encounter.   None Consent given for treatment. Patient examined. All patient's and/or POA's questions/concerns addressed on today's visit. Mycotic toenails 1-5 debrided in length and girth without incident. Continue foot and shoe inspections daily. Monitor blood glucose per PCP/Endocrinologist's recommendations.Continue soft, supportive shoe gear daily. Report any pedal injuries to medical professional. Call office if there are any quesitons/concerns. -Patient/POA to call should there be question/concern in the interim.   Return in about 3 months (around 12/05/2023).  Luella Sager, DPM      [place] LOCATION: [address] [place] [address]                   Office [phone]   [facility] LOCATION: [address] [place], [place] [address] Office [phone]

## 2023-09-08 ENCOUNTER — Encounter: Payer: Self-pay | Admitting: Podiatry

## 2023-09-08 ENCOUNTER — Other Ambulatory Visit (HOSPITAL_COMMUNITY): Payer: Self-pay | Admitting: Urology

## 2023-09-08 DIAGNOSIS — C61 Malignant neoplasm of prostate: Secondary | ICD-10-CM

## 2023-09-09 DIAGNOSIS — D122 Benign neoplasm of ascending colon: Secondary | ICD-10-CM | POA: Diagnosis not present

## 2023-09-09 DIAGNOSIS — Z860100 Personal history of colon polyps, unspecified: Secondary | ICD-10-CM | POA: Diagnosis not present

## 2023-09-09 DIAGNOSIS — K5730 Diverticulosis of large intestine without perforation or abscess without bleeding: Secondary | ICD-10-CM | POA: Diagnosis not present

## 2023-09-09 DIAGNOSIS — Z09 Encounter for follow-up examination after completed treatment for conditions other than malignant neoplasm: Secondary | ICD-10-CM | POA: Diagnosis not present

## 2023-09-09 DIAGNOSIS — D128 Benign neoplasm of rectum: Secondary | ICD-10-CM | POA: Diagnosis not present

## 2023-09-11 DIAGNOSIS — D122 Benign neoplasm of ascending colon: Secondary | ICD-10-CM | POA: Diagnosis not present

## 2023-09-11 DIAGNOSIS — D128 Benign neoplasm of rectum: Secondary | ICD-10-CM | POA: Diagnosis not present

## 2023-10-06 ENCOUNTER — Encounter (HOSPITAL_COMMUNITY)
Admission: RE | Admit: 2023-10-06 | Discharge: 2023-10-06 | Disposition: A | Discharge status: Home / Self Care | Source: Ambulatory Visit | Attending: Urology | Admitting: Urology

## 2023-10-06 DIAGNOSIS — C61 Malignant neoplasm of prostate: Secondary | ICD-10-CM | POA: Diagnosis not present

## 2023-10-06 DIAGNOSIS — D7389 Other diseases of spleen: Secondary | ICD-10-CM | POA: Diagnosis not present

## 2023-10-06 MED ORDER — TECHNETIUM TC 99M MEDRONATE IV KIT
20.0000 | PACK | Freq: Once | INTRAVENOUS | Status: AC | PRN
  Administered 2023-10-06: 20 via INTRAVENOUS

## 2023-10-08 DIAGNOSIS — R3915 Urgency of urination: Secondary | ICD-10-CM | POA: Diagnosis not present

## 2023-10-08 DIAGNOSIS — C61 Malignant neoplasm of prostate: Secondary | ICD-10-CM | POA: Diagnosis not present

## 2023-10-08 DIAGNOSIS — M8580 Other specified disorders of bone density and structure, unspecified site: Secondary | ICD-10-CM | POA: Diagnosis not present

## 2023-10-08 DIAGNOSIS — N130 Hydronephrosis with ureteropelvic junction obstruction: Secondary | ICD-10-CM | POA: Diagnosis not present

## 2023-10-22 DIAGNOSIS — K621 Rectal polyp: Secondary | ICD-10-CM | POA: Diagnosis not present

## 2023-10-27 ENCOUNTER — Telehealth: Payer: Self-pay

## 2023-10-27 NOTE — Telephone Encounter (Signed)
   Pre-operative Risk Assessment    Patient Name: Ryan Saunders  DOB: May 29, 1945 MRN: [id number]   Date of last office visit: 06/24/2021 P. Nahser, MD Date of next office visit: NA  Request for Surgical Clearance    Procedure:  Transanal excision of distal rectal lesion surgery  Date of Surgery:  Clearance TBD                                 Surgeon:  Lonni Pizza, MD Surgeon's Group or Practice Name:  [facility]  Phone number:  [phone] Fax number:  [phone] (Atten: Coleta Molt, CMA)   Type of Clearance Requested:   - Medical    Type of Anesthesia:  General    Additional requests/questions:    [name]   10/27/2023, 5:23 PM

## 2023-10-28 NOTE — Telephone Encounter (Signed)
   Name: Ryan Saunders  DOB: 02-06-1946  MRN: [id number]  Primary Cardiologist: Aleene Passe, MD  Chart reviewed as part of pre-operative protocol coverage. Because of Tatsuya Okray Berres's past medical history and time since last visit, he will require a follow-up in-office visit in order to better assess preoperative cardiovascular risk. Last seen by Dr. Passe on 06/24/2021  Pre-op covering staff: - Please schedule appointment and call patient to inform them. If patient already had an upcoming appointment within acceptable timeframe, please add pre-op clearance to the appointment notes so provider is aware.  - Please contact requesting surgeon's office via preferred method (i.e, phone, fax) to inform them of need for appointment prior to surgery.    Lamarr Satterfield, NP  10/28/2023, 10:36 AM

## 2023-10-28 NOTE — Telephone Encounter (Signed)
 Patient has been scheduled for IN OFFICE VISIT

## 2023-11-03 ENCOUNTER — Encounter (HOSPITAL_BASED_OUTPATIENT_CLINIC_OR_DEPARTMENT_OTHER): Payer: Self-pay | Admitting: Family

## 2023-11-03 ENCOUNTER — Ambulatory Visit (HOSPITAL_BASED_OUTPATIENT_CLINIC_OR_DEPARTMENT_OTHER): Admitting: Family

## 2023-11-03 VITALS — BP 120/64 | HR 55 | Ht 72.0 in | Wt 173.0 lb

## 2023-11-03 DIAGNOSIS — I10 Essential (primary) hypertension: Secondary | ICD-10-CM

## 2023-11-03 DIAGNOSIS — E785 Hyperlipidemia, unspecified: Secondary | ICD-10-CM | POA: Diagnosis not present

## 2023-11-03 DIAGNOSIS — I25118 Atherosclerotic heart disease of native coronary artery with other forms of angina pectoris: Secondary | ICD-10-CM | POA: Diagnosis not present

## 2023-11-03 DIAGNOSIS — Z01810 Encounter for preprocedural cardiovascular examination: Secondary | ICD-10-CM | POA: Diagnosis not present

## 2023-11-03 NOTE — Patient Instructions (Signed)
 Medication Instructions:   Your physician recommends that you continue on your current medications as directed. Please refer to the Current Medication list given to you today.   *If you need a refill on your cardiac medications before your next appointment, please call your pharmacy*  Lab Work:  None ordered.  If you have labs (blood work) drawn today and your tests are completely normal, you will receive your results only by: MyChart Message (if you have MyChart) OR A paper copy in the mail If you have any lab test that is abnormal or we need to change your treatment, we will call you to review the results.  Testing/Procedures:  None ordered.  Follow-Up: At [facility], you and your health needs are our priority.  As part of our continuing mission to provide you with exceptional heart care, our providers are all part of one team.  This team includes your primary Cardiologist (physician) and Advanced Practice Providers or APPs (Physician Assistants and Nurse Practitioners) who all work together to provide you with the care you need, when you need it.  Your next appointment:   1 year(s)  Provider:   Reche Finder, NP    We recommend signing up for the patient portal called MyChart.  Sign up information is provided on this After Visit Summary.  MyChart is used to connect with patients for Virtual Visits (Telemedicine).  Patients are able to view lab/test results, encounter notes, upcoming appointments, etc.  Non-urgent messages can be sent to your provider as well.   To learn more about what you can do with MyChart, go to [email or web address].   Other Instructions  Your physician wants you to follow-up in: 1 year.  You will receive a reminder letter in the mail two months in advance. If you don't receive a letter, please call our office to schedule the follow-up appointment.

## 2023-11-03 NOTE — Progress Notes (Signed)
  Cardiology Office Note   Date:  11/03/2023  ID:  [name], DOB 1945/08/30, MRN [id number] PCP: Vernadine Charlie ORN, MD  [facility] Providers Cardiologist:  None Cardiology APP:  Vannie Reche RAMAN, NP     History of Present Illness Ryan Saunders is a 78 y.o. male with history of mild coronary artery disease by catheterization 2007, HTN, HLD, DM2, prostate cancer.  Previously had palpitations which improved with hydration.  Prior ABI 07/2021 no evidence of PAD.  Presents today for preop clearance for transanal excision of distal rectal lesion surgery with Dr. Teresa. Reports no shortness of breath nor dyspnea on exertion. Reports no chest pain, pressure, or tightness. No edema, orthopnea, PND. Reports no palpitations.  Endorses staying active including playing golf weekly. Endorses following a heart healthy diet.   ROS: Please see the history of present illness.    All other systems reviewed and are negative.   Studies Reviewed          Risk Assessment/Calculations           Physical Exam VS:  BP 120/64 (BP Location: Right Arm, Patient Position: Sitting, Cuff Size: Normal)   Pulse (!) 55   Ht 6' (1.829 m)   Wt 173 lb (78.5 kg)   SpO2 98%   BMI 23.46 kg/m        Wt Readings from Last 3 Encounters:  11/03/23 173 lb (78.5 kg)  06/05/23 192 lb 9.6 oz (87.4 kg)  06/24/21 192 lb 9.6 oz (87.4 kg)    GEN: Well nourished, well developed in no acute distress NECK: No JVD; No carotid bruits CARDIAC: RRR, no murmurs, rubs, gallops RESPIRATORY:  Clear to auscultation without rales, wheezing or rhonchi  ABDOMEN: Soft, non-tender, non-distended EXTREMITIES:  No edema; No deformity   ASSESSMENT AND PLAN  Preop - According to the Revised Cardiac Risk Index (RCRI), his Perioperative Risk of Major Cardiac Event is (%): 0.9. His Functional Capacity in METs is: 8.33 according to the [employer] Activity Status Index (DASI). Per AHA/ACC guidelines, he is deemed acceptable risk  for the planned procedure without additional cardiovascular testing. Will route to surgical team so they are aware.  He does not take antiplatelet nor anticoagulant and would need to be held prior to surgery.  CAD / HLD, LDL goal <70 - Stable with no anginal symptoms. No indication for ischemic evaluation.  GDMT includes atorvastatin 80 mg daily, Farxiga 10 mg daily, Zetia 10 mg daily.  Recommend aiming for 150 minutes of moderate intensity activity per week and following a heart healthy diet.    RBBB - stable finding by EKG. No lightheadedness, dizziness.  Avoid AV nodal blocking agent.  Monitor with periodic EKG.  HTN - BP well controlled. Continue current antihypertensive regimen.    DM2 - Continue to follow with PCP. Appreciate inclusion of SGLT2i and GLP1.       Dispo: follow up in 1 year with Reche RAMAN Vannie, NP   Signed, Reche RAMAN Vannie, NP

## 2023-11-10 NOTE — Progress Notes (Signed)
Pt. Needs orders for surgery. 

## 2023-11-10 NOTE — Patient Instructions (Signed)
 SURGICAL WAITING ROOM VISITATION Patients having surgery or a procedure may have no more than 2 support people in the waiting area - these visitors may rotate in the visitor waiting room.   Due to an increase in RSV and influenza rates and associated hospitalizations, children ages 83 and under may not visit patients in [facility] hospitals. If the patient needs to stay at the hospital during part of their recovery, the visitor guidelines for inpatient rooms apply.  PRE-OP VISITATION  Pre-op nurse will coordinate an appropriate time for 1 support person to accompany the patient in pre-op.  This support person may not rotate.  This visitor will be contacted when the time is appropriate for the visitor to come back in the pre-op area.  Please refer to the [facility] website for the visitor guidelines for Inpatients (after your surgery is over and you are in a regular room).  You are not required to quarantine at this time prior to your surgery. However, you must do this: Hand Hygiene often Do NOT share personal items Notify your provider if you are in close contact with someone who has COVID or you develop fever 100.4 or greater, new onset of sneezing, cough, sore throat, shortness of breath or body aches.  If you test positive for Covid or have been in contact with anyone that has tested positive in the last 10 days please notify you surgeon.    Your procedure is scheduled on:  11/25/23  Report to [facility] Main Entrance: [place] entrance where the [employer] is available.   Report to admitting at: 6:15 AM  Call this number if you have any questions or problems the morning of surgery [phone]  FOLLOW ANY ADDITIONAL PRE OP INSTRUCTIONS YOU RECEIVED FROM YOUR SURGEON'S OFFICE!!!  Do not eat food after Midnight the night prior to your surgery/procedure.  After Midnight you may have the following liquids until: 5:30 AM DAY OF SURGERY  Clear Liquid Diet Water Black  Coffee (sugar ok, NO MILK/CREAM OR CREAMERS)  Tea (sugar ok, NO MILK/CREAM OR CREAMERS) regular and decaf                             Plain Jell-O  with no fruit (NO RED)                                           Fruit ices (not with fruit pulp, NO RED)                                     Popsicles (NO RED)                                                                  Juice: NO CITRUS JUICES: only apple, WHITE grape, WHITE cranberry Sports drinks like Gatorade or Powerade (NO RED)               Oral Hygiene is also important to reduce your risk of infection.        Remember -  BRUSH YOUR TEETH THE MORNING OF SURGERY WITH YOUR REGULAR TOOTHPASTE  Do NOT smoke after Midnight the night before surgery.  STOP TAKING all Vitamins, Herbs and supplements 1 week before your surgery.   Take ONLY these medicines the morning of surgery with A SIP OF WATER: NONE                   How to Manage Your Diabetes Before and After Surgery  Why is it important to control my blood sugar before and after surgery? Improving blood sugar levels before and after surgery helps healing and can limit problems. A way of improving blood sugar control is eating a healthy diet by:  Eating less sugar and carbohydrates  Increasing activity/exercise  Talking with your doctor about reaching your blood sugar goals High blood sugars (greater than 180 mg/dL) can raise your risk of infections and slow your recovery, so you will need to focus on controlling your diabetes during the weeks before surgery. Make sure that the doctor who takes care of your diabetes knows about your planned surgery including the date and location.  How do I manage my blood sugar before surgery? Check your blood sugar at least 4 times a day, starting 2 days before surgery, to make sure that the level is not too high or low. Check your blood sugar the morning of your surgery when you wake up and every 2 hours until you get to the Short Stay unit. If  your blood sugar is less than 70 mg/dL, you will need to treat for low blood sugar: Do not take insulin. Treat a low blood sugar (less than 70 mg/dL) with  cup of clear juice (cranberry or apple), 4 glucose tablets, OR glucose gel. Recheck blood sugar in 15 minutes after treatment (to make sure it is greater than 70 mg/dL). If your blood sugar is not greater than 70 mg/dL on recheck, call [phone] for further instructions. Report your blood sugar to the short stay nurse when you get to Short Stay.  If you are admitted to the hospital after surgery: Your blood sugar will be checked by the staff and you will probably be given insulin after surgery (instead of oral diabetes medicines) to make sure you have good blood sugar levels. The goal for blood sugar control after surgery is 80-180 mg/dL.   WHAT DO I DO ABOUT MY DIABETES MEDICATION?  HOLD farxiga after:11/21/23  THE NIGHT BEFORE SURGERY, take ONLY half of the lantus insulin dose.      THE MORNING OF SURGERY, take ONLY half of the lantus insulin dose.  DO NOT TAKE THE FOLLOWING 7 DAYS PRIOR TO SURGERY: Ozempic, Wegovy, Rybelsus (Semaglutide), Byetta (exenatide), Bydureon (exenatide ER), Victoza, Saxenda (liraglutide), or Trulicity (dulaglutide) Mounjaro (Tirzepatide) Adlyxin (Lixisenatide), Polyethylene Glycol Loxenatide.HOLD ozempic after: 11/17/23  You may not have any metal on your body including hair pins, jewelry, and body piercing  Do not wear lotions, powders, perfumes / cologne, or deodorant  Men may shave face and neck.  Contacts, Hearing Aids, dentures or bridgework may not be worn into surgery. DENTURES WILL BE REMOVED PRIOR TO SURGERY PLEASE DO NOT APPLY Poly grip OR ADHESIVES!!!  You may bring a small overnight bag with you on the day of surgery, only pack items that are not valuable. [facility] IS NOT RESPONSIBLE   FOR VALUABLES THAT ARE LOST OR STOLEN.   Patients discharged on the day of surgery will not be allowed  to drive home.  Someone  NEEDS to stay with you for the first 24 hours after anesthesia.  Do not bring your home medications to the hospital. The Pharmacy will dispense medications listed on your medication list to you during your admission in the Hospital.  Special Instructions: Bring a copy of your healthcare power of attorney and living will documents the day of surgery, if you wish to have them scanned into your [facility]- EPIC  Please read over the following fact sheets you were given: IF YOU HAVE QUESTIONS ABOUT YOUR PRE-OP INSTRUCTIONS, PLEASE CALL [phone]   [facility] - Preparing for Surgery Before surgery, you can play an important role.  Because skin is not sterile, your skin needs to be as free of germs as possible.  You can reduce the number of germs on your skin by washing with CHG (chlorahexidine gluconate) soap before surgery.  CHG is an antiseptic cleaner which kills germs and bonds with the skin to continue killing germs even after washing. Please DO NOT use if you have an allergy to CHG or antibacterial soaps.  If your skin becomes reddened/irritated stop using the CHG and inform your nurse when you arrive at Short Stay. Do not shave (including legs and underarms) for at least 48 hours prior to the first CHG shower.  You may shave your face/neck.  Please follow these instructions carefully:  1.  Shower with CHG Soap the night before surgery and the  morning of surgery.  2.  If you choose to wash your hair, wash your hair first as usual with your normal  shampoo.  3.  After you shampoo, rinse your hair and body thoroughly to remove the shampoo.                             4.  Use CHG as you would any other liquid soap.  You can apply chg directly to the skin and wash.  Gently with a scrungie or clean washcloth.  5.  Apply the CHG Soap to your body ONLY FROM THE NECK DOWN.   Do not use on face/ open                           Wound or open sores. Avoid contact  with eyes, ears mouth and genitals (private parts).                       Wash face,  Genitals (private parts) with your normal soap.             6.  Wash thoroughly, paying special attention to the area where your  surgery  will be performed.  7.  Thoroughly rinse your body with warm water from the neck down.  8.  DO NOT shower/wash with your normal soap after using and rinsing off the CHG Soap.            9.  Pat yourself dry with a clean towel.            10.  Wear clean pajamas.            11.  Place clean sheets on your bed the night of your first shower and do not  sleep with pets.  ON THE DAY OF SURGERY : Do not apply any lotions/deodorants the morning of surgery.  Please wear clean clothes to the hospital/surgery center.  FAILURE TO FOLLOW THESE INSTRUCTIONS MAY RESULT IN THE CANCELLATION OF YOUR SURGERY  PATIENT SIGNATURE_________________________________  NURSE SIGNATURE__________________________________  ________________________________________________________________________

## 2023-11-11 ENCOUNTER — Ambulatory Visit (HOSPITAL_COMMUNITY): Payer: Self-pay | Admitting: Surgery

## 2023-11-11 ENCOUNTER — Encounter (HOSPITAL_COMMUNITY): Payer: Self-pay

## 2023-11-11 ENCOUNTER — Other Ambulatory Visit: Payer: Self-pay

## 2023-11-11 ENCOUNTER — Encounter (HOSPITAL_COMMUNITY)
Admission: RE | Admit: 2023-11-11 | Discharge: 2023-11-11 | Disposition: A | Discharge status: Home / Self Care | Source: Ambulatory Visit | Attending: Surgery | Admitting: Surgery

## 2023-11-11 VITALS — BP 150/70 | HR 55 | Temp 97.8°F | Ht 72.0 in | Wt 169.0 lb

## 2023-11-11 DIAGNOSIS — N182 Chronic kidney disease, stage 2 (mild): Secondary | ICD-10-CM | POA: Diagnosis not present

## 2023-11-11 DIAGNOSIS — I493 Ventricular premature depolarization: Secondary | ICD-10-CM | POA: Insufficient documentation

## 2023-11-11 DIAGNOSIS — E1122 Type 2 diabetes mellitus with diabetic chronic kidney disease: Secondary | ICD-10-CM | POA: Diagnosis not present

## 2023-11-11 DIAGNOSIS — Z87891 Personal history of nicotine dependence: Secondary | ICD-10-CM | POA: Diagnosis not present

## 2023-11-11 DIAGNOSIS — Z794 Long term (current) use of insulin: Secondary | ICD-10-CM | POA: Diagnosis not present

## 2023-11-11 DIAGNOSIS — Z01812 Encounter for preprocedural laboratory examination: Secondary | ICD-10-CM | POA: Insufficient documentation

## 2023-11-11 DIAGNOSIS — Z7984 Long term (current) use of oral hypoglycemic drugs: Secondary | ICD-10-CM | POA: Insufficient documentation

## 2023-11-11 DIAGNOSIS — K5090 Crohn's disease, unspecified, without complications: Secondary | ICD-10-CM | POA: Diagnosis not present

## 2023-11-11 DIAGNOSIS — E1151 Type 2 diabetes mellitus with diabetic peripheral angiopathy without gangrene: Secondary | ICD-10-CM | POA: Insufficient documentation

## 2023-11-11 DIAGNOSIS — I10 Essential (primary) hypertension: Secondary | ICD-10-CM

## 2023-11-11 DIAGNOSIS — K621 Rectal polyp: Secondary | ICD-10-CM | POA: Diagnosis not present

## 2023-11-11 DIAGNOSIS — I2510 Atherosclerotic heart disease of native coronary artery without angina pectoris: Secondary | ICD-10-CM | POA: Diagnosis not present

## 2023-11-11 DIAGNOSIS — Z7985 Long-term (current) use of injectable non-insulin antidiabetic drugs: Secondary | ICD-10-CM | POA: Diagnosis not present

## 2023-11-11 DIAGNOSIS — Z8546 Personal history of malignant neoplasm of prostate: Secondary | ICD-10-CM | POA: Diagnosis not present

## 2023-11-11 DIAGNOSIS — E1121 Type 2 diabetes mellitus with diabetic nephropathy: Secondary | ICD-10-CM | POA: Insufficient documentation

## 2023-11-11 DIAGNOSIS — Z923 Personal history of irradiation: Secondary | ICD-10-CM | POA: Diagnosis not present

## 2023-11-11 DIAGNOSIS — I129 Hypertensive chronic kidney disease with stage 1 through stage 4 chronic kidney disease, or unspecified chronic kidney disease: Secondary | ICD-10-CM | POA: Diagnosis not present

## 2023-11-11 HISTORY — DX: Unspecified osteoarthritis, unspecified site: M19.90

## 2023-11-11 HISTORY — DX: Type 2 diabetes mellitus without complications: E11.9

## 2023-11-11 LAB — CBC
HCT: 42.1 % (ref 39.0–52.0)
Hemoglobin: 14 g/dL (ref 13.0–17.0)
MCH: 31.4 pg (ref 26.0–34.0)
MCHC: 33.3 g/dL (ref 30.0–36.0)
MCV: 94.4 fL (ref 80.0–100.0)
Platelets: 163 K/uL (ref 150–400)
RBC: 4.46 MIL/uL (ref 4.22–5.81)
RDW: 13.1 % (ref 11.5–15.5)
WBC: 7.2 K/uL (ref 4.0–10.5)
nRBC: 0 % (ref 0.0–0.2)

## 2023-11-11 LAB — BASIC METABOLIC PANEL WITH GFR
Anion gap: 7 (ref 5–15)
BUN: 22 mg/dL (ref 8–23)
CO2: 24 mmol/L (ref 22–32)
Calcium: 8.7 mg/dL — ABNORMAL LOW (ref 8.9–10.3)
Chloride: 106 mmol/L (ref 98–111)
Creatinine, Ser: 1.26 mg/dL — ABNORMAL HIGH (ref 0.61–1.24)
GFR, Estimated: 58 mL/min — ABNORMAL LOW (ref >60)
Glucose, Bld: 174 mg/dL — ABNORMAL HIGH (ref 70–99)
Potassium: 4.1 mmol/L (ref 3.5–5.1)
Sodium: 137 mmol/L (ref 135–145)

## 2023-11-11 LAB — GLUCOSE, CAPILLARY: Glucose-Capillary: 140 mg/dL — ABNORMAL HIGH (ref 70–99)

## 2023-11-11 LAB — HEMOGLOBIN A1C
Hgb A1c MFr Bld: 5.8 % — ABNORMAL HIGH (ref 4.8–5.6)
Mean Plasma Glucose: 119.76 mg/dL

## 2023-11-11 NOTE — Progress Notes (Addendum)
 For Anesthesia: PCP - Tisovec, Charlie ORN, MD  Cardiologist - NONE Clearance: Ryan Reche RAMAN, NP : 11/03/23 Bowel Prep reminder:  Chest x-ray -  EKG - 11/03/23 Stress Test -  ECHO -  Cardiac Cath -  Pacemaker/ICD device last checked: Pacemaker orders received: Device Rep notified:  Spinal Cord Stimulator:N/A  Sleep Study - N/A CPAP -   Fasting Blood Sugar - 80's - 100's Checks Blood Sugar __1___ times a day Date and result of last Hgb A1c-  Last dose of GLP1 agonist- ozempic GLP1 instructions: : To hold it after: 11/12/23  Last dose of SGLT-2 inhibitors- Farxiga SGLT-2 instructions: To hold it after: 11/21/23  Blood Thinner Instructions: Aspirin Instructions: Last Dose:  Activity level: Can go up a flight of stairs and activities of daily living without stopping and without chest pain and/or shortness of breath   Able to exercise without chest pain and/or shortness of breath  Anesthesia review: Hx:HTN,CAD,PVD,PVC,RBBB.  Patient denies shortness of breath, fever, cough and chest pain at PAT appointment   Patient verbalized understanding of instructions that were reviewed over the telephone.

## 2023-11-16 ENCOUNTER — Encounter (HOSPITAL_COMMUNITY): Payer: Self-pay

## 2023-11-16 NOTE — Anesthesia Preprocedure Evaluation (Addendum)
 Anesthesia Evaluation  Patient identified by MRN, date of birth, ID band Patient awake    Reviewed: Allergy & Precautions, NPO status , Patient's Chart, lab work & pertinent test results  Airway Mallampati: III  TM Distance: >3 FB Neck ROM: Full    Dental no notable dental hx. (+) Edentulous Upper, Upper Dentures, Partial Lower   Pulmonary former smoker   Pulmonary exam normal breath sounds clear to auscultation       Cardiovascular hypertension, Pt. on medications + CAD and + Peripheral Vascular Disease  Normal cardiovascular exam Rhythm:Regular Rate:Normal     Neuro/Psych negative neurological ROS  negative psych ROS   GI/Hepatic negative GI ROS, Neg liver ROS,,,Crohn's   Endo/Other  diabetes, Type 2, Oral Hypoglycemic Agents, Insulin  Dependent    Renal/GU Renal InsufficiencyRenal disease  negative genitourinary   Musculoskeletal  (+) Arthritis ,    Abdominal   Peds  Hematology negative hematology ROS (+)   Anesthesia Other Findings   Reproductive/Obstetrics                              Anesthesia Physical Anesthesia Plan  ASA: 3  Anesthesia Plan: General   Post-op Pain Management: Tylenol  PO (pre-op)*   Induction: Intravenous  PONV Risk Score and Plan: 2 and Dexamethasone , Ondansetron  and Treatment may vary due to age or medical condition  Airway Management Planned: Oral ETT  Additional Equipment:   Intra-op Plan:   Post-operative Plan: Extubation in OR  Informed Consent: I have reviewed the patients History and Physical, chart, labs and discussed the procedure including the risks, benefits and alternatives for the proposed anesthesia with the patient or authorized representative who has indicated his/her understanding and acceptance.     Dental advisory given  Plan Discussed with: CRNA  Anesthesia Plan Comments: (See PAT note from 7/30)         Anesthesia  Quick Evaluation

## 2023-11-16 NOTE — Progress Notes (Signed)
 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  Pt follows with Cardiology for CAD (mild by cath in 2008). Undergoing medical management. Seen in clinic on 11/03/23 for cardiac clearance. He denied any cardiac symptoms and reported being active. Cleared for surgery:  Preop - According to the Revised Cardiac Risk Index (RCRI), his Perioperative Risk of Major Cardiac Event is (%): 0.9. His Functional Capacity in METs is: 8.33 according to the [facility] Activity Status Index (DASI). Per AHA/ACC guidelines, he is deemed acceptable risk for the planned procedure without additional cardiovascular testing. Will route to surgical team so they are aware.  He does not take antiplatelet nor anticoagulant and would need to be held prior to surgery.  Pt follows with PCP. Last seen 05/2023. DM controlled (A1c 5.8). He is on insulin. All other issues stable at that visit. Baseline Creatinine 1-1.3.  LD Farxiga: 8/9 LD Ozempic: 7/31  VS: BP (!) 150/70   Pulse (!) 55   Temp 36.6 C (Oral)   Ht 6' (1.829 m)   Wt 76.7 kg   SpO2 97%   BMI 22.92 kg/m   PROVIDERS: Tisovec, Charlie ORN, MD   LABS: Labs reviewed: Acceptable for surgery.  (all labs ordered are listed, but only abnormal results are displayed)  Labs Reviewed  HEMOGLOBIN A1C - Abnormal; Notable for the following components:      Result Value   Hgb A1c MFr Bld 5.8 (*)    All other components within normal limits  BASIC METABOLIC PANEL WITH GFR - Abnormal; Notable for the following components:   Glucose, Bld 174 (*)    Creatinine, Ser 1.26 (*)    Calcium 8.7 (*)     GFR, Estimated 58 (*)    All other components within normal limits  GLUCOSE, CAPILLARY - Abnormal; Notable for the following components:   Glucose-Capillary 140 (*)    All other components within normal limits  CBC     IMAGES:   EKG 11/03/23:  Sinus bradycardia, rate 56 Right bundle branch block  CV:  LHC 09/23/2006:   CONCLUSIONS:  1. Minor coronary artery irregularities.  2. Normal left ventricular systolic function.    We will continue with medical therapy.   Past Medical History:  Diagnosis Date   Arthritis    Cancer [facility])    PROSTATE CANCER   Coronary artery disease    Crohn's disease (HCC)    Diabetes mellitus without complication (HCC)    Hyperlipidemia    Hypertension    PVC (premature ventricular contraction)    PVD (peripheral vascular disease) (HCC)     Past Surgical History:  Procedure Laterality Date   ANAL FISTULA REPAIR     CATARACT EXTRACTION, BILATERAL     CRYOTHERAPY  04/14/2001   every 7 years   HERNIA REPAIR     INSERTION PROSTATE RADIATION SEED  04/14/2001   VASECTOMY  04/14/1970    MEDICATIONS:  abiraterone acetate (ZYTIGA) 250 MG tablet   atorvastatin (LIPITOR) 80 MG tablet   B Complex Vitamins (B COMPLEX PO)   CALCIUM PO   dapagliflozin  propanediol (FARXIGA) 10 MG TABS tablet   LANTUS SOLOSTAR 100 UNIT/ML Solostar Pen   leuprolide , 6 Month, (LEUPROLIDE  ACETATE, 6 MONTH,) 45 MG injection   lisinopril (ZESTRIL) 20 MG tablet   Multiple Vitamins-Minerals (PRESERVISION AREDS 2) CAPS   OZEMPIC, 1 MG/DOSE, 4 MG/3ML SOPN   predniSONE  (DELTASONE ) 5 MG tablet   ZETIA 10 MG tablet   No current facility-administered medications for this encounter.   [name] CHRISTELLA Odis DEVONNA [facility] Short Stay/Anesthesiology [facility] Phone [phone] 11/16/2023 8:41 AM

## 2023-11-25 ENCOUNTER — Ambulatory Visit (HOSPITAL_COMMUNITY)
Admission: RE | Admit: 2023-11-25 | Discharge: 2023-11-25 | Disposition: A | Discharge status: Home / Self Care | Source: Ambulatory Visit | Attending: Surgery | Admitting: Surgery

## 2023-11-25 ENCOUNTER — Encounter (HOSPITAL_COMMUNITY)
Admission: RE | Disposition: A | Discharge status: Home / Self Care | Payer: Self-pay | Source: Ambulatory Visit | Attending: Surgery

## 2023-11-25 ENCOUNTER — Encounter (HOSPITAL_COMMUNITY): Payer: Self-pay | Admitting: Surgery

## 2023-11-25 ENCOUNTER — Ambulatory Visit (HOSPITAL_COMMUNITY): Payer: Self-pay | Admitting: Medical

## 2023-11-25 ENCOUNTER — Other Ambulatory Visit: Payer: Self-pay

## 2023-11-25 ENCOUNTER — Ambulatory Visit (HOSPITAL_COMMUNITY): Admitting: Certified Registered"

## 2023-11-25 DIAGNOSIS — Z87891 Personal history of nicotine dependence: Secondary | ICD-10-CM | POA: Insufficient documentation

## 2023-11-25 DIAGNOSIS — E785 Hyperlipidemia, unspecified: Secondary | ICD-10-CM | POA: Diagnosis not present

## 2023-11-25 DIAGNOSIS — I2510 Atherosclerotic heart disease of native coronary artery without angina pectoris: Secondary | ICD-10-CM | POA: Insufficient documentation

## 2023-11-25 DIAGNOSIS — Z8546 Personal history of malignant neoplasm of prostate: Secondary | ICD-10-CM | POA: Insufficient documentation

## 2023-11-25 DIAGNOSIS — Z7984 Long term (current) use of oral hypoglycemic drugs: Secondary | ICD-10-CM | POA: Insufficient documentation

## 2023-11-25 DIAGNOSIS — Z794 Long term (current) use of insulin: Secondary | ICD-10-CM | POA: Diagnosis not present

## 2023-11-25 DIAGNOSIS — K5090 Crohn's disease, unspecified, without complications: Secondary | ICD-10-CM | POA: Insufficient documentation

## 2023-11-25 DIAGNOSIS — K31A22 Gastric intestinal metaplasia with high grade dysplasia: Secondary | ICD-10-CM

## 2023-11-25 DIAGNOSIS — K621 Rectal polyp: Secondary | ICD-10-CM | POA: Diagnosis not present

## 2023-11-25 DIAGNOSIS — E1151 Type 2 diabetes mellitus with diabetic peripheral angiopathy without gangrene: Secondary | ICD-10-CM | POA: Diagnosis not present

## 2023-11-25 DIAGNOSIS — I10 Essential (primary) hypertension: Secondary | ICD-10-CM | POA: Insufficient documentation

## 2023-11-25 DIAGNOSIS — D128 Benign neoplasm of rectum: Secondary | ICD-10-CM | POA: Diagnosis not present

## 2023-11-25 HISTORY — PX: TUMOR EXCISION: SHX421

## 2023-11-25 LAB — GLUCOSE, CAPILLARY
Glucose-Capillary: 106 mg/dL — ABNORMAL HIGH (ref 70–99)
Glucose-Capillary: 94 mg/dL (ref 70–99)

## 2023-11-25 SURGERY — TRANSRECTAL TUMOR EXCISION
Anesthesia: General

## 2023-11-25 MED ORDER — LIDOCAINE HCL (CARDIAC) PF 100 MG/5ML IV SOSY
PREFILLED_SYRINGE | INTRAVENOUS | Status: DC | PRN
  Administered 2023-11-25 (×2): 50 mg via INTRAVENOUS

## 2023-11-25 MED ORDER — BUPIVACAINE-EPINEPHRINE (PF) 0.25% -1:200000 IJ SOLN
INTRAMUSCULAR | Status: AC
  Filled 2023-11-25: qty 30

## 2023-11-25 MED ORDER — DIBUCAINE (PERIANAL) 1 % EX OINT
TOPICAL_OINTMENT | CUTANEOUS | Status: DC | PRN
  Administered 2023-11-25 (×2): 1 via RECTAL

## 2023-11-25 MED ORDER — EPHEDRINE 5 MG/ML INJ
INTRAVENOUS | Status: AC
  Filled 2023-11-25: qty 5

## 2023-11-25 MED ORDER — PROPOFOL 10 MG/ML IV BOLUS
INTRAVENOUS | Status: DC | PRN
  Administered 2023-11-25: 100 mg via INTRAVENOUS
  Administered 2023-11-25: 40 mg via INTRAVENOUS
  Administered 2023-11-25: 100 mg via INTRAVENOUS
  Administered 2023-11-25: 40 mg via INTRAVENOUS

## 2023-11-25 MED ORDER — SUGAMMADEX SODIUM 200 MG/2ML IV SOLN
INTRAVENOUS | Status: AC
  Filled 2023-11-25: qty 2

## 2023-11-25 MED ORDER — ROCURONIUM BROMIDE 100 MG/10ML IV SOLN
INTRAVENOUS | Status: DC | PRN
  Administered 2023-11-25 (×2): 50 mg via INTRAVENOUS

## 2023-11-25 MED ORDER — CHLORHEXIDINE GLUCONATE CLOTH 2 % EX PADS: 6.0000 | MEDICATED_PAD | Freq: Once | CUTANEOUS | Status: DC

## 2023-11-25 MED ORDER — AMISULPRIDE (ANTIEMETIC) 5 MG/2ML IV SOLN: 10.0000 mg | Freq: Once | INTRAVENOUS | Status: DC | PRN

## 2023-11-25 MED ORDER — LACTATED RINGERS IV SOLN: INTRAVENOUS | Status: DC

## 2023-11-25 MED ORDER — ESMOLOL HCL 100 MG/10ML IV SOLN
INTRAVENOUS | Status: DC | PRN
  Administered 2023-11-25 (×2): 20 mg via INTRAVENOUS

## 2023-11-25 MED ORDER — BUPIVACAINE-EPINEPHRINE (PF) 0.25% -1:200000 IJ SOLN
INTRAMUSCULAR | Status: DC | PRN
  Administered 2023-11-25 (×2): 50 mL

## 2023-11-25 MED ORDER — FENTANYL CITRATE (PF) 100 MCG/2ML IJ SOLN
INTRAMUSCULAR | Status: DC | PRN
  Administered 2023-11-25 (×2): 25 ug via INTRAVENOUS
  Administered 2023-11-25: 50 ug via INTRAVENOUS
  Administered 2023-11-25 (×2): 25 ug via INTRAVENOUS
  Administered 2023-11-25: 50 ug via INTRAVENOUS

## 2023-11-25 MED ORDER — PHENYLEPHRINE 80 MCG/ML (10ML) SYRINGE FOR IV PUSH (FOR BLOOD PRESSURE SUPPORT)
PREFILLED_SYRINGE | INTRAVENOUS | Status: AC
  Filled 2023-11-25: qty 20

## 2023-11-25 MED ORDER — ONDANSETRON HCL 4 MG/2ML IJ SOLN
INTRAMUSCULAR | Status: AC
  Filled 2023-11-25: qty 2

## 2023-11-25 MED ORDER — OXYCODONE HCL 5 MG PO TABS: 5.0000 mg | ORAL_TABLET | Freq: Four times a day (QID) | ORAL | 0 refills | Status: AC | PRN

## 2023-11-25 MED ORDER — PHENYLEPHRINE HCL (PRESSORS) 10 MG/ML IV SOLN
INTRAVENOUS | Status: DC | PRN
  Administered 2023-11-25 (×2): 160 ug via INTRAVENOUS

## 2023-11-25 MED ORDER — PROPOFOL 10 MG/ML IV BOLUS
INTRAVENOUS | Status: AC
  Filled 2023-11-25: qty 20

## 2023-11-25 MED ORDER — DIBUCAINE (PERIANAL) 1 % EX OINT
TOPICAL_OINTMENT | CUTANEOUS | Status: AC
  Filled 2023-11-25: qty 28

## 2023-11-25 MED ORDER — ACETAMINOPHEN 500 MG PO TABS
1000.0000 mg | ORAL_TABLET | ORAL | Status: AC
  Administered 2023-11-25 (×2): 1000 mg via ORAL
  Filled 2023-11-25: qty 2

## 2023-11-25 MED ORDER — FENTANYL CITRATE PF 50 MCG/ML IJ SOSY: 25.0000 ug | PREFILLED_SYRINGE | INTRAMUSCULAR | Status: DC | PRN

## 2023-11-25 MED ORDER — SUGAMMADEX SODIUM 200 MG/2ML IV SOLN
INTRAVENOUS | Status: DC | PRN
Start: 2023-11-25 — End: 2023-11-25
  Administered 2023-11-25 (×2): 200 mg via INTRAVENOUS

## 2023-11-25 MED ORDER — 0.9 % SODIUM CHLORIDE (POUR BTL) OPTIME
TOPICAL | Status: DC | PRN
  Administered 2023-11-25 (×2): 1000 mL

## 2023-11-25 MED ORDER — ONDANSETRON HCL 4 MG/2ML IJ SOLN
INTRAMUSCULAR | Status: DC | PRN
  Administered 2023-11-25 (×2): 4 mg via INTRAVENOUS

## 2023-11-25 MED ORDER — SODIUM CHLORIDE 0.9 % IV SOLN
2.0000 g | INTRAVENOUS | Status: AC
  Administered 2023-11-25 (×2): 2 g via INTRAVENOUS
  Filled 2023-11-25: qty 2

## 2023-11-25 MED ORDER — PHENYLEPHRINE HCL-NACL 20-0.9 MG/250ML-% IV SOLN
INTRAVENOUS | Status: DC | PRN
  Administered 2023-11-25 (×2): 10 ug/min via INTRAVENOUS

## 2023-11-25 MED ORDER — LIDOCAINE HCL (PF) 2 % IJ SOLN
INTRAMUSCULAR | Status: AC
  Filled 2023-11-25: qty 5

## 2023-11-25 MED ORDER — INSULIN ASPART 100 UNIT/ML IJ SOLN: 0.0000 [IU] | INTRAMUSCULAR | Status: DC | PRN

## 2023-11-25 MED ORDER — ROCURONIUM BROMIDE 10 MG/ML (PF) SYRINGE
PREFILLED_SYRINGE | INTRAVENOUS | Status: AC
Start: 2023-11-25 — End: 2023-11-25
  Filled 2023-11-25: qty 10

## 2023-11-25 MED ORDER — GLYCOPYRROLATE 0.2 MG/ML IJ SOLN
INTRAMUSCULAR | Status: AC
Start: 2023-11-25 — End: 2023-11-25
  Filled 2023-11-25: qty 1

## 2023-11-25 MED ORDER — FENTANYL CITRATE (PF) 100 MCG/2ML IJ SOLN
INTRAMUSCULAR | Status: AC
  Filled 2023-11-25: qty 2

## 2023-11-25 MED ORDER — BUPIVACAINE LIPOSOME 1.3 % IJ SUSP: 20.0000 mL | Freq: Once | INTRAMUSCULAR | Status: DC

## 2023-11-25 MED ORDER — OXYCODONE HCL 5 MG/5ML PO SOLN: 5.0000 mg | Freq: Once | ORAL | Status: DC | PRN

## 2023-11-25 MED ORDER — DEXAMETHASONE SODIUM PHOSPHATE 10 MG/ML IJ SOLN
INTRAMUSCULAR | Status: AC
  Filled 2023-11-25: qty 1

## 2023-11-25 MED ORDER — CHLORHEXIDINE GLUCONATE 0.12 % MT SOLN
15.0000 mL | Freq: Once | OROMUCOSAL | Status: AC
  Administered 2023-11-25 (×2): 15 mL via OROMUCOSAL

## 2023-11-25 MED ORDER — PHENYLEPHRINE 80 MCG/ML (10ML) SYRINGE FOR IV PUSH (FOR BLOOD PRESSURE SUPPORT)
PREFILLED_SYRINGE | INTRAVENOUS | Status: AC
  Filled 2023-11-25: qty 10

## 2023-11-25 MED ORDER — FLEET ENEMA RE ENEM
1.0000 | ENEMA | Freq: Once | RECTAL | Status: DC
  Filled 2023-11-25: qty 1

## 2023-11-25 MED ORDER — ORAL CARE MOUTH RINSE: 15.0000 mL | Freq: Once | OROMUCOSAL | Status: AC

## 2023-11-25 MED ORDER — EPHEDRINE 5 MG/ML INJ
INTRAVENOUS | Status: AC
Start: 2023-11-25 — End: 2023-11-25
  Filled 2023-11-25: qty 5

## 2023-11-25 MED ORDER — OXYCODONE HCL 5 MG PO TABS: 5.0000 mg | ORAL_TABLET | Freq: Once | ORAL | Status: DC | PRN

## 2023-11-25 SURGICAL SUPPLY — 39 items
BAG COUNTER SPONGE SURGICOUNT (BAG) IMPLANT
BENZOIN TINCTURE PRP APPL 2/3 (GAUZE/BANDAGES/DRESSINGS) IMPLANT
BLADE SURG 15 STRL LF DISP TIS (BLADE) IMPLANT
BRIEF MESH DISP LRG (UNDERPADS AND DIAPERS) ×2 IMPLANT
COVER SURGICAL LIGHT HANDLE (MISCELLANEOUS) ×2 IMPLANT
DISSECTOR SURG LIGASURE 21 (MISCELLANEOUS) IMPLANT
DRAPE LAPAROTOMY T 102X78X121 (DRAPES) ×2 IMPLANT
DRAPE UTILITY XL STRL (DRAPES) IMPLANT
ELECT BLADE TIP CTD 4 INCH (ELECTRODE) IMPLANT
ELECT NDL TIP 2.8 STRL (NEEDLE) ×2 IMPLANT
ELECT NEEDLE TIP 2.8 STRL (NEEDLE) ×1 IMPLANT
ELECT PENCIL ROCKER SW 15FT (MISCELLANEOUS) IMPLANT
ELECT REM PT RETURN 15FT ADLT (MISCELLANEOUS) ×2 IMPLANT
GAUZE 4X4 16PLY ~~LOC~~+RFID DBL (SPONGE) ×2 IMPLANT
GAUZE PAD ABD 8X10 STRL (GAUZE/BANDAGES/DRESSINGS) IMPLANT
GAUZE SPONGE 4X4 12PLY STRL (GAUZE/BANDAGES/DRESSINGS) IMPLANT
GLOVE BIO SURGEON STRL SZ7.5 (GLOVE) ×2 IMPLANT
GLOVE INDICATOR 8.0 STRL GRN (GLOVE) ×2 IMPLANT
GOWN STRL REUS W/ TWL XL LVL3 (GOWN DISPOSABLE) ×2 IMPLANT
KIT BASIN OR (CUSTOM PROCEDURE TRAY) ×2 IMPLANT
KIT TURNOVER KIT A (KITS) ×2 IMPLANT
LOOP VESSEL MAXI BLUE (MISCELLANEOUS) IMPLANT
NDL HYPO 22X1.5 SAFETY MO (MISCELLANEOUS) ×2 IMPLANT
NEEDLE HYPO 22X1.5 SAFETY MO (MISCELLANEOUS) ×1 IMPLANT
PACK BASIC VI WITH GOWN DISP (CUSTOM PROCEDURE TRAY) ×2 IMPLANT
PAK SCROTO (SET/KITS/TRAYS/PACK) IMPLANT
RETRACTOR RING URO 16.6X16.6 (MISCELLANEOUS) IMPLANT
RETRACTOR STAY HOOK 5MM (MISCELLANEOUS) IMPLANT
SHEARS HARMONIC 9CM CVD (BLADE) IMPLANT
SPIKE FLUID TRANSFER (MISCELLANEOUS) ×2 IMPLANT
SURGILUBE 2OZ TUBE FLIPTOP (MISCELLANEOUS) ×2 IMPLANT
SUT CHROMIC 2 0 SH (SUTURE) IMPLANT
SUT CHROMIC 3 0 SH 27 (SUTURE) IMPLANT
SUT VIC AB 2-0 SH 27X BRD (SUTURE) IMPLANT
SUT VIC AB 2-0 UR6 27 (SUTURE) ×4 IMPLANT
SYR 20ML LL LF (SYRINGE) ×2 IMPLANT
SYR BULB IRRIG 60ML STRL (SYRINGE) IMPLANT
TAPE PAPER 3X10 WHT MICROPORE (GAUZE/BANDAGES/DRESSINGS) IMPLANT
TOWEL OR 17X26 10 PK STRL BLUE (TOWEL DISPOSABLE) ×2 IMPLANT

## 2023-11-25 NOTE — Discharge Instructions (Addendum)

## 2023-11-25 NOTE — H&P (Signed)
 CC: Here today for surgery  HPI: Ryan Saunders is an 78 y.o. male with history of CAD, HTN, DM, HLD, prostate cancer, possible Crohn's whom is seen in the office today as a referral by Ryan Saunders for evaluation of distal rectal lesion.  Colonoscopy by Ryan Saunders 09/09/23: - Hemorrhoids - Polyp in distal rectum/anal, removed piecemeal using a hot and cold snare. Resected and retrieved. - Diverticulosis in sigmoid - Ascending colon polyp biopsied.  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  By report through Ryan Saunders notes, there is also notation is made of Ryan Saunders having had some ileal thickening on Ryan Saunders CT scan as well as a colonoscopy by Ryan Saunders which demonstrated Crohn's disease in the terminal ileum. Ryan Saunders was noted to have been fairly quiescent off any medication therapy.  Currently, Ryan Saunders denies any complaints or symptoms. Ryan Saunders denies any anorectal pain or bleeding. Ryan Saunders feels well otherwise.  Ryan Saunders denies any changes in health or health history since we met in the office. No new medications/allergies. Ryan Saunders states Ryan Saunders is ready for surgery today.  PMH: CAD, HTN, DM, HL,D prostate cancer, possible Crohn's  PSH: Prostate (seeds, cryo - 2003, 2005) Anal fistula surgery - Ryan Saunders 2011 - rigid procto + fistulotomy (right anterior, between scrotum and perianal skin)  FHx: Denies any known family history of colorectal, breast, endometrial or ovarian cancer  Social Hx: Denies use of tobacco/illicit drug. 1 alcoholic drink per day. 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 Ryan Saunders is here today with Ryan Saunders.   Past Medical History:  Diagnosis Date   Arthritis    Cancer [facility])    PROSTATE CANCER   Coronary artery disease    Crohn's disease (HCC)    Diabetes mellitus without complication (HCC)    Hyperlipidemia     Hypertension    PVC (premature ventricular contraction)    PVD (peripheral vascular disease) (HCC)     Past Surgical History:  Procedure Laterality Date   ANAL FISTULA REPAIR     CATARACT EXTRACTION, BILATERAL     CRYOTHERAPY  04/14/2001   every 7 years   HERNIA REPAIR     INSERTION PROSTATE RADIATION SEED  04/14/2001   VASECTOMY  04/14/1970    Family History  Problem Relation Age of Onset   CVA Mother    Heart attack Mother    Crohn's disease Mother    Diabetes Father    Heart failure Paternal Grandmother     Social:  reports that Ryan Saunders quit smoking about 19 years ago. Ryan Saunders smoking use included cigarettes. Ryan Saunders started smoking about 69 years ago. Ryan Saunders has a 100 pack-year smoking history. Ryan Saunders has never used smokeless tobacco. Ryan Saunders reports current alcohol  use of about 1.0 standard drink of alcohol  per week. Ryan Saunders reports that Ryan Saunders does not use drugs.  Allergies:  Allergies  Allergen Reactions   Bee Venom Swelling    swelling in lips and mouth      Medications: I have reviewed the Ryan Saunders's current medications.  Results for orders placed or performed during the hospital encounter of 11/25/23 (from the past 48 hours)  Glucose, capillary     Status: Abnormal   Collection Time: 11/25/23  6:47 AM  Result Value Ref Range   Glucose-Capillary 106 (H) 70 - 99 mg/dL    Comment: Glucose reference range applies only to samples taken after fasting for at least  8 hours.   Comment 1 Notify RN     No results found.   PE Blood pressure (!) 157/57, pulse (!) 48, temperature 97.6 F (36.4 C), temperature source Oral, resp. rate 18, height 6' (1.829 m), weight 78 kg, SpO2 99%. Constitutional: NAD; conversant Eyes: Moist conjunctiva; no lid lag; anicteric Lungs: Normal respiratory effort CV: RRR Psychiatric: Appropriate affect  Results for orders placed or performed during the hospital encounter of 11/25/23 (from the past 48 hours)  Glucose, capillary     Status: Abnormal   Collection Time:  11/25/23  6:47 AM  Result Value Ref Range   Glucose-Capillary 106 (H) 70 - 99 mg/dL    Comment: Glucose reference range applies only to samples taken after fasting for at least 8 hours.   Comment 1 Notify RN     No results found.  A/P: Ryan Saunders is an 78 y.o. male with hx of CAD, HTN, DM, HL,D prostate cancer here for evaluation of distal rectal lesion  -Cardiac clearance obtained  -The anatomy and physiology of the anal canal was discussed with the Ryan Saunders with associated pictures. The pathophysiology of rectal polyps was discussed at length with associated pictures and illustrations. -We have reviewed options going forward and recommending surgery given the piecemeal status of Ryan Saunders dysplastic polyp that was found in the distalmost rectum- transanal excision of distal rectal lesion; anorectal exam under anesthesia  -The planned procedure, material risks (including, but not limited to, pain, bleeding, infection, scarring, need for blood transfusion, damage to anal sphincter, incontinence of gas and/or stool, need for additional procedures, anal stenosis, rare cases of pelvic sepsis which in severe cases may require things like a colostomy, recurrence, pneumonia, heart attack, stroke, death) benefits and alternatives to surgery were discussed at length. I noted a good probability that the procedure would help improve their symptoms. The Ryan Saunders's questions were answered to Ryan Saunders and Ryan Saunders's satisfaction, they voiced understanding and elected to proceed with surgery. Additionally, we discussed typical postoperative expectations and the recovery process.   Ryan Pizza, MD [facility]

## 2023-11-25 NOTE — Transfer of Care (Signed)
 Immediate Anesthesia Transfer of Care Note  Patient: Ryan Saunders  Procedure(s) Performed: TRANSRECTAL TUMOR EXCISION  Patient Location: PACU  Anesthesia Type:General  Level of Consciousness: awake, alert , oriented, and patient cooperative  Airway & Oxygen Therapy: Patient Spontanous Breathing and Patient connected to face mask oxygen  Post-op Assessment: Report given to RN and Post -op Vital signs reviewed and stable  Post vital signs: Reviewed and stable  Last Vitals:  Vitals Value Taken Time  BP 147/75 11/25/23 09:39  Temp    Pulse 72 11/25/23 09:42  Resp 11 11/25/23 09:41  SpO2 99 % 11/25/23 09:42  Vitals shown include unfiled device data.  Last Pain:  Vitals:   11/25/23 0731  TempSrc:   PainSc: 0-No pain         Complications: No notable events documented.

## 2023-11-25 NOTE — Anesthesia Procedure Notes (Signed)
 Procedure Name: Intubation Date/Time: 11/25/2023 8:35 AM  Performed by: Rhodia Debby MATSU, CRNAPre-anesthesia Checklist: Patient identified, Emergency Drugs available, Suction available, Patient being monitored and Timeout performed Patient Re-evaluated:Patient Re-evaluated prior to induction Oxygen Delivery Method: Circle system utilized Preoxygenation: Pre-oxygenation with 100% oxygen Induction Type: IV induction Ventilation: Mask ventilation without difficulty Laryngoscope Size: Miller and 2 Grade View: Grade I Tube type: Oral Tube size: 7.5 mm Number of attempts: 1 Airway Equipment and Method: Stylet Placement Confirmation: ETT inserted through vocal cords under direct vision, positive ETCO2 and breath sounds checked- equal and bilateral Secured at: 22 cm Tube secured with: Tape Dental Injury: Teeth and Oropharynx as per pre-operative assessment  Comments: Smooth brief atraumatic dentition unchanged

## 2023-11-25 NOTE — Op Note (Signed)
 11/25/2023  9:47 AM  PATIENT:  Ryan Saunders  78 y.o. male  Patient Care Team: Tisovec, Charlie ORN, MD as PCP - General (Internal Medicine) Vannie Reche RAMAN, NP as Nurse Practitioner (Cardiology)  PRE-OPERATIVE DIAGNOSIS:  Distal rectal polyp with dysplasia  POST-OPERATIVE DIAGNOSIS:  Same  PROCEDURE:   Transanal excision of rectal polypoid mass/lesion Anorectal exam under anesthesia  SURGEON:  Surgeon(s): Laurence Folz, Lonni HERO, MD  ASSISTANT: Starleen Barrier, MD  ANESTHESIA:   local and general  SPECIMEN:   Posterior midline distal rectal lesion (Distal-dark blue; proximal-pink; quite-right; red-left)   DISPOSITION OF SPECIMEN:  PATHOLOGY  COUNTS:  Sponge, needle, and instrument counts were reported correct x2 at conclusion.  EBL: 5 mL  Drains: none  PLAN OF CARE: Discharge to home after PACU  PATIENT DISPOSITION:  PACU - hemodynamically stable.  OR FINDINGS: Posterior midline distal rectal polypoid lesion consistent with endoscopic findings.  Friable surfaces.  Full-thickness type excision down to the level of sphincter muscle and approximately down to the level of perirectal fat. Defect closed. Specimen was oriented with the above-noted cauterized pins and walked to pathology at conclusion.    DESCRIPTION: The patient was seen in the pre-op holding area. The risks, benefits, complications, treatment options, and expected outcomes were previously discussed with the patient. The patient agreed with the proposed plan and has signed the informed consent form. The patient was brought to the operating room by the surgical team, identified as Ryan L. Wall, and the procedure verified. SCD's were applied. General anesthesia was induced without difficulty. The patient was then rolled onto the OR table in the prone jackknife position.  Pressure points were evaluated and padded.  Benzoin was applied to the buttocks and they were gently taped apart.  He was then prepped and  draped in usual sterile fashion. A time out was completed and the above information confirmed and need for preoperative antibiotics.  A perianal block was then created using a dilute mixture of 0.25% Marcaine  with epinephrine .  After ascertaining an appropriate level of anesthesia had been achieved, a well lubricated digital rectal exam was performed. This demonstrated nodular lesion palpable in the posterior midline.  No other pertinent findings on DRE.  A Hill-Ferguson anoscope was into the anal canal and circumferential inspection demonstrated healthy appearing anoderm.  In the posterior midline, there is a friable approximately 1 x 3 cm lesion involving the distal rectum and extending down to the dentate line.  This appears consistent with endoscopic findings/recurrence.  It is soft.  There is no ulceration.  Small internal hemorrhoids.  Attention is then directed at excision.  Borders are marked circumferentially with electrocautery such that we include at least 5 mm of normal tissue circumferentially.  Using electrocautery, we began distally first and again this is at the level of the dentate line.  The lesion is excised distally followed by laterally and then terminated proximally.  The depth of excision extends down to the internal sphincter muscle distally and into the perirectal fat proximally.  No sphincter muscle is divided.  Orientation is maintained. Hemostasis is noted. A sponge is placed in the anal canal.  The specimen is then oriented on a cork board using colored pins.  Dark blue distal; pink proximal; Chon Buhl right; red left.  I then scrubbed back in.  With the anoscope in place, the distal rectum is hemostatic.  Attention is then directed at closure.  Circumferential anoscopy first demonstrates no other evident lesions within the distal rectum or anal  canal.  2-0 Vicryl interrupted sutures were then used to approximate the distal rectal mucosa to the level of the dentate line.  The  rectum is irrigated.  Hemostasis is verified.  Additional local anesthetic is then infiltrated at the excision site.  All sponge, needle, and instrument counts are reported correct.  The buttocks are untaped.  Topical Dibucaine is applied.  A dressing consisting of 4 x 4's, ABD, mesh underwear is ultimately placed.  He was rolled back onto a stretcher, awake from anesthesia, extubated, and transferred to recovery in satisfactory condition.  DISPOSITION: PACU in satisfactory condition.

## 2023-11-26 ENCOUNTER — Encounter (HOSPITAL_COMMUNITY): Payer: Self-pay | Admitting: Surgery

## 2023-11-26 LAB — SURGICAL PATHOLOGY

## 2023-11-26 NOTE — Anesthesia Postprocedure Evaluation (Signed)
 Anesthesia Post Note  Patient: Ryan Saunders  Procedure(s) Performed: TRANSRECTAL TUMOR EXCISION     Patient location during evaluation: PACU Anesthesia Type: General Level of consciousness: awake and alert Pain management: pain level controlled Vital Signs Assessment: post-procedure vital signs reviewed and stable Respiratory status: spontaneous breathing, nonlabored ventilation, respiratory function stable and patient connected to nasal cannula oxygen Cardiovascular status: blood pressure returned to baseline and stable Postop Assessment: no apparent nausea or vomiting Anesthetic complications: no   No notable events documented.  Last Vitals:  Vitals:   11/25/23 1026 11/25/23 1030  BP: 104/66 (!) 142/70  Pulse:  67  Resp: 16   Temp: 36.4 C   SpO2:  100%    Last Pain:  Vitals:   11/25/23 1026  TempSrc: Oral  PainSc:                  Mady Oubre L Cayde Held

## 2023-11-30 DIAGNOSIS — R809 Proteinuria, unspecified: Secondary | ICD-10-CM | POA: Diagnosis not present

## 2023-12-02 ENCOUNTER — Ambulatory Visit: Payer: Self-pay | Admitting: Surgery

## 2023-12-07 ENCOUNTER — Ambulatory Visit: Admitting: Podiatry

## 2023-12-07 ENCOUNTER — Encounter: Payer: Self-pay | Admitting: Podiatry

## 2023-12-07 DIAGNOSIS — M79675 Pain in left toe(s): Secondary | ICD-10-CM

## 2023-12-07 DIAGNOSIS — E1151 Type 2 diabetes mellitus with diabetic peripheral angiopathy without gangrene: Secondary | ICD-10-CM

## 2023-12-07 DIAGNOSIS — M79674 Pain in right toe(s): Secondary | ICD-10-CM

## 2023-12-07 DIAGNOSIS — B351 Tinea unguium: Secondary | ICD-10-CM | POA: Diagnosis not present

## 2023-12-07 NOTE — Progress Notes (Signed)
  Subjective:  Patient ID: Ryan Saunders, male    DOB: 29-Jan-1946,  MRN: [id number]  Ryan Saunders presents to clinic today for at risk foot care. Pt has h/o NIDDM with PAD and painful thick toenails that are difficult to trim. Pain interferes with ambulation. Aggravating factors include wearing enclosed shoe gear. Pain is relieved with periodic professional debridement. Patient is accompanied by his wife on today's visit. He states his 2nd toes have been tender. Chief Complaint  Patient presents with   [facility]    Rm3 Diabetic foot care/ Dr. Charlie Reas last visit November 30, 2023/A1c 5.8   New problem(s): None.   PCP is Tisovec, Charlie ORN, MD.  Allergies  Allergen Reactions   Bee Venom Swelling    swelling in lips and mouth      Review of Systems: Negative except as noted in the HPI.  Objective: There were no vitals filed for this visit. Ryan Saunders is a pleasant 78 y.o. male WD, WN in NAD. AAO x 3.  Vascular Examination: DP pulse palpable right foot and faintly palpable left foot. Palpable PT pulses b/l. CFT immediate b/l. Pedal pulses decreased.. No edema. No pain with calf compression b/l. Skin temperature gradient WNL b/l. No varicosities noted. No cyanosis or clubbing noted.  Neurological Examination: Sensation grossly intact b/l with 10 gram monofilament. Vibratory sensation intact b/l.  Dermatological Examination: Pedal integument with normal turgor, texture and tone BLE. No open wounds b/l LE. No interdigital macerations noted b/l LE.   Toenails 3-5 bilaterally and R hallux elongated, discolored, dystrophic, thickened, and crumbly with subungual debris and tenderness to dorsal palpation.   Incurvated nailplate both nail borders of bilateral 2nd toes with tenderness to palpation. No erythema, no edema, no drainage noted. No fluctuance.  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   Musculoskeletal Examination: Muscle strength 5/5 to b/l LE. Limited  joint ROM to the 1st MPJ right foot. Patient ambulates independently without assistive aids.   Radiographs: None  Assessment/Plan: 1. Pain due to onychomycosis of toenails of both feet   2. Type II diabetes mellitus with peripheral circulatory disorder [facility])      Patient was evaluated and treated. All patient's and/or POA's questions/concerns addressed on today's visit. Toenails 3-5 bilaterally and right great toe debrided in length and girth without incident. Continue foot and shoe inspections daily. Monitor blood glucose per PCP/Endocrinologist's recommendations. Continue soft, supportive shoe gear daily. Report any pedal injuries to medical professional. Call office if there are any questions/concerns. No invasive procedure(s) performed. Offending nail border debrided and curretaged both nail borders of bilateral 2nd toes utilizing sterile nail nipper and currette. Border cleansed with alcohol . No further treatment required by patient/caregiver. Discussed procedure for permanent ingrown toenail removal. Will shorten appointment interval to 9 weeks. Call office if there are any concerns.  Return in about 9 weeks (around 02/08/2024).  Delon LITTIE Merlin, DPM      [place] LOCATION: [address] [place] [address]                   Office [phone]   [facility] LOCATION: [address] [place], [place] [address] Office [phone]

## 2023-12-10 DIAGNOSIS — E113292 Type 2 diabetes mellitus with mild nonproliferative diabetic retinopathy without macular edema, left eye: Secondary | ICD-10-CM | POA: Diagnosis not present

## 2023-12-10 DIAGNOSIS — H26493 Other secondary cataract, bilateral: Secondary | ICD-10-CM | POA: Diagnosis not present

## 2023-12-10 DIAGNOSIS — H43813 Vitreous degeneration, bilateral: Secondary | ICD-10-CM | POA: Diagnosis not present

## 2023-12-10 DIAGNOSIS — H52203 Unspecified astigmatism, bilateral: Secondary | ICD-10-CM | POA: Diagnosis not present

## 2023-12-10 DIAGNOSIS — H524 Presbyopia: Secondary | ICD-10-CM | POA: Diagnosis not present

## 2023-12-10 DIAGNOSIS — H40013 Open angle with borderline findings, low risk, bilateral: Secondary | ICD-10-CM | POA: Diagnosis not present

## 2024-02-11 ENCOUNTER — Ambulatory Visit: Admitting: Podiatry

## 2024-02-11 DIAGNOSIS — B351 Tinea unguium: Secondary | ICD-10-CM

## 2024-02-11 DIAGNOSIS — E1151 Type 2 diabetes mellitus with diabetic peripheral angiopathy without gangrene: Secondary | ICD-10-CM

## 2024-02-11 DIAGNOSIS — M79675 Pain in left toe(s): Secondary | ICD-10-CM | POA: Diagnosis not present

## 2024-02-11 DIAGNOSIS — M79674 Pain in right toe(s): Secondary | ICD-10-CM

## 2024-02-11 NOTE — Progress Notes (Signed)
  Subjective:  Patient ID: Ryan Saunders, male    DOB: Dec 23, 1945,  MRN: [id number]  Ryan Saunders presents to clinic today for at risk foot care. Pt has h/o NIDDM with PAD and painful mycotic toenails of both feet that are difficult to trim. Pain interferes with daily activities and wearing enclosed shoe gear comfortably. His wife is present on today's visit. Chief Complaint  Patient presents with   Toe Pain    Diabetic foot care. A1c 5.8. Dr. Tisovec is his PCP.   New problem(s): None.   PCP is Tisovec, Ryan ORN, Ryan Saunders. Ryan Saunders 11/30/2023.  Allergies  Allergen Reactions   Bee Venom Swelling    swelling in lips and mouth      Review of Systems: Negative except as noted in the HPI.  Objective: No changes noted in today's physical examination. There were no vitals filed for this visit. Ryan Saunders is a pleasant 78 y.o. male WD, WN in NAD. AAO x 3.  Vascular Examination: DP pulse palpable right foot and faintly palpable left foot. Palpable PT pulses b/l. CFT immediate b/l. Pedal pulses decreased.. No edema. No pain with calf compression b/l. Skin temperature gradient WNL b/l. No varicosities noted. No cyanosis or clubbing noted.  Neurological Examination: Sensation grossly intact b/l with 10 gram monofilament. Vibratory sensation intact b/l.  Dermatological Examination: Pedal integument with normal turgor, texture and tone BLE. No open wounds b/l LE. No interdigital macerations noted b/l LE.   Toenails 3-5 bilaterally and R hallux elongated, discolored, dystrophic, thickened, and crumbly with subungual debris and tenderness to dorsal palpation.   Incurvated nailplate both nail borders of bilateral 2nd toes with tenderness to palpation. No erythema, no edema, no drainage noted. No fluctuance.  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   Musculoskeletal Examination: Muscle strength 5/5 to b/l LE. Limited joint ROM to the 1st MPJ right foot. Patient ambulates  independently without assistive aids.   Radiographs: None  Assessment/Plan: 1. Pain due to onychomycosis of toenails of both feet   2. Type II diabetes mellitus with peripheral circulatory disorder [facility])   -Patient's family member present. All questions/concerns addressed on today's visit. -Consent given for treatment as described below: -Examined patient. -Patient to continue soft, supportive shoe gear daily. -Toenails were debrided in length and girth 3-5 bilaterally and R hallux with sterile nail nippers and dremel without iatrogenic bleeding.  -No invasive procedure(s) performed. Offending nail border debrided and curretaged bilateral 2nd toes utilizing sterile nail nipper and currette. Border(s) cleansed with alcohol  and triple antibiotic ointment applied. Patient/POA/Caregiver/Facility instructed to apply Neosporin Cream  to bilateral 2nd toes once daily for 7 days. Call office if there are any concerns. -Patient/POA to call should there be question/concern in the interim.   Return in about 9 weeks (around 04/14/2024).  Ryan Saunders, DPM      [place] LOCATION: [address] [place] [address]                   Office [phone]   [facility] LOCATION: [address] [place], [place] [address] Office [phone]

## 2024-02-14 ENCOUNTER — Encounter: Payer: Self-pay | Admitting: Podiatry

## 2024-03-16 DIAGNOSIS — D485 Neoplasm of uncertain behavior of skin: Secondary | ICD-10-CM | POA: Diagnosis not present

## 2024-04-22 ENCOUNTER — Ambulatory Visit: Admitting: Podiatry

## 2024-04-22 DIAGNOSIS — M79675 Pain in left toe(s): Secondary | ICD-10-CM

## 2024-04-22 DIAGNOSIS — B351 Tinea unguium: Secondary | ICD-10-CM | POA: Diagnosis not present

## 2024-04-22 DIAGNOSIS — M79674 Pain in right toe(s): Secondary | ICD-10-CM

## 2024-04-22 DIAGNOSIS — E1151 Type 2 diabetes mellitus with diabetic peripheral angiopathy without gangrene: Secondary | ICD-10-CM

## 2024-04-29 ENCOUNTER — Encounter: Payer: Self-pay | Admitting: Podiatry

## 2024-04-29 NOTE — Progress Notes (Signed)
 "  Subjective:  Patient ID: Ryan Saunders, male    DOB: [birth year],  MRN: [id number]  Ryan Saunders presents to clinic today for at risk foot care. Pt has h/o NIDDM with PAD and painful thick toenails that are difficult to trim. Pain interferes with ambulation. Aggravating factors include wearing enclosed shoe gear. Pain is relieved with periodic professional debridement. He is accompanied by his wife on today's visit. Patient states his right 2nd and 3rd toes are sore on the lateral borders. Chief Complaint  Patient presents with   Diabetes    A1c is 5. He has an appointment in Feb with Dr. Vernadine   New problem(s): None.   PCP is Tisovec, Charlie ORN, MD.  Allergies[1]  Review of Systems: Negative except as noted in the HPI.  Objective:  There were no vitals filed for this visit. Ryan LITTIE Chalker is a pleasant 79 y.o. male WD, WN in NAD. AAO x 3.    Subjective:  Patient ID: Ryan Saunders, male    DOB: 09-15-1945,  MRN: [id number]  79 y.o. male presents to clinic today with at risk foot care. Pt has h/o NIDDM with PAD and painful thick toenails that are difficult to trim. Pain interferes with ambulation. Aggravating factors include wearing enclosed shoe gear. Pain is relieved with periodic professional debridement. He states his right 2nd and 3rd toes are sore. Chief Complaint  Patient presents with   Diabetes    A1c is 5. He has an appointment in Feb with Dr. Tisovec    PCP is Tisovec, Charlie ORN, MD.  Allergies[2]  Review of Systems: Negative except as noted in the HPI.   Objective:  Ryan Saunders is a pleasant 79 y.o. male WD, WN in NAD. AAO x 3.  Vascular Examination: DP pulse palpable right foot and faintly palpable left foot. Palpable PT pulses b/l. CFT immediate b/l. Pedal pulses decreased.. No edema. No pain with calf compression b/l. Skin temperature gradient WNL b/l. No varicosities noted. No cyanosis or clubbing noted.  Neurological  Examination: Sensation grossly intact b/l with 10 gram monofilament. Vibratory sensation intact b/l.  Dermatological Examination: Pedal integument with normal turgor, texture and tone BLE. No open wounds b/l LE. No interdigital macerations noted b/l LE.   Toenails 3-5 bilaterally and R hallux elongated, discolored, dystrophic, thickened, and crumbly with subungual debris and tenderness to dorsal palpation.   Incurvated nailplate lateral nail borders right 2nd and 3rd toes with tenderness to palpation. No erythema, no edema, no drainage noted. No fluctuance.  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   Musculoskeletal Examination: Muscle strength 5/5 to b/l LE. Limited joint ROM to the 1st MPJ right foot. 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  Patient ambulates independently without assistive aids.   Radiographs: None  Last A1c:      Latest Ref Rng & Units 11/11/2023    8:40 AM  Hemoglobin A1C  Hemoglobin-A1c 4.8 - 5.6 % 5.8    Assessment:   1. Pain due to onychomycosis of toenails of both feet   2. Type II diabetes mellitus with peripheral circulatory disorder [facility])    Plan:  -Patient was evaluated today. All questions/concerns addressed on today's visit. -Patient's family member present. All questions/concerns addressed on today's visit. -Examined patient. -Discussed matrixectomy procedure available for chronic ingrown toenails. -Patient to continue soft, supportive shoe gear daily. -Mycotic toenails 1-5 bilaterally were debrided in length and girth with sterile nail nippers and dremel without iatrogenic bleeding. -No  invasive procedure(s) performed. Offending nail border debrided and curretaged lateral border of R 2nd toe and R 3rd toe utilizing sterile nail nipper and currette. Border cleansed with alcohol  and triple antibiotic ointment applied. No further treatment required by patient/caregiver. Call office if there are any concerns. -Patient/POA to call should  there be question/concern in the interim.  Return in about 9 weeks (around 06/24/2024).  Delon LITTIE Merlin, DPM      [place] LOCATION: [address] [place] [address]                   Office [phone]   [place] LOCATION: [address] [place], [place] [address] Office [phone]     [1]  Allergies Allergen Reactions   Bee Venom Swelling    swelling in lips and mouth    [2]  Allergies Allergen Reactions   Bee Venom Swelling    swelling in lips and mouth     "

## 2024-06-27 ENCOUNTER — Ambulatory Visit: Admitting: Podiatry
